# Patient Record
Sex: Female | Born: 1955 | Race: White | Hispanic: No | Marital: Married | Smoking: Never smoker
Health system: Southern US, Community
[De-identification: ages and names within clinical notes are randomized; demographics above are authoritative.]

## PROBLEM LIST (undated history)

## (undated) DIAGNOSIS — F32A Depression, unspecified: Secondary | ICD-10-CM

## (undated) DIAGNOSIS — E079 Disorder of thyroid, unspecified: Secondary | ICD-10-CM

## (undated) DIAGNOSIS — K5730 Diverticulosis of large intestine without perforation or abscess without bleeding: Secondary | ICD-10-CM

## (undated) DIAGNOSIS — R0602 Shortness of breath: Secondary | ICD-10-CM

## (undated) DIAGNOSIS — M2550 Pain in unspecified joint: Secondary | ICD-10-CM

## (undated) DIAGNOSIS — E039 Hypothyroidism, unspecified: Secondary | ICD-10-CM

## (undated) DIAGNOSIS — I10 Essential (primary) hypertension: Secondary | ICD-10-CM

## (undated) DIAGNOSIS — H539 Unspecified visual disturbance: Secondary | ICD-10-CM

## (undated) DIAGNOSIS — K5792 Diverticulitis of intestine, part unspecified, without perforation or abscess without bleeding: Secondary | ICD-10-CM

## (undated) DIAGNOSIS — J302 Other seasonal allergic rhinitis: Secondary | ICD-10-CM

## (undated) DIAGNOSIS — K219 Gastro-esophageal reflux disease without esophagitis: Secondary | ICD-10-CM

## (undated) DIAGNOSIS — R519 Headache, unspecified: Secondary | ICD-10-CM

## (undated) DIAGNOSIS — K5900 Constipation, unspecified: Secondary | ICD-10-CM

## (undated) DIAGNOSIS — E559 Vitamin D deficiency, unspecified: Secondary | ICD-10-CM

## (undated) DIAGNOSIS — R5383 Other fatigue: Secondary | ICD-10-CM

## (undated) DIAGNOSIS — M7989 Other specified soft tissue disorders: Secondary | ICD-10-CM

## (undated) DIAGNOSIS — M549 Dorsalgia, unspecified: Secondary | ICD-10-CM

## (undated) DIAGNOSIS — J301 Allergic rhinitis due to pollen: Secondary | ICD-10-CM

## (undated) DIAGNOSIS — M1990 Unspecified osteoarthritis, unspecified site: Secondary | ICD-10-CM

## (undated) DIAGNOSIS — F329 Major depressive disorder, single episode, unspecified: Secondary | ICD-10-CM

## (undated) DIAGNOSIS — F909 Attention-deficit hyperactivity disorder, unspecified type: Secondary | ICD-10-CM

## (undated) DIAGNOSIS — J45909 Unspecified asthma, uncomplicated: Secondary | ICD-10-CM

## (undated) DIAGNOSIS — Z8719 Personal history of other diseases of the digestive system: Secondary | ICD-10-CM

## (undated) DIAGNOSIS — F419 Anxiety disorder, unspecified: Secondary | ICD-10-CM

## (undated) DIAGNOSIS — F1090 Alcohol use, unspecified, uncomplicated: Secondary | ICD-10-CM

## (undated) DIAGNOSIS — Z7289 Other problems related to lifestyle: Secondary | ICD-10-CM

## (undated) HISTORY — DX: Essential (primary) hypertension: I10

## (undated) HISTORY — DX: Depression, unspecified: F32.A

## (undated) HISTORY — DX: Pain in unspecified joint: M25.50

## (undated) HISTORY — DX: Allergic rhinitis due to pollen: J30.1

## (undated) HISTORY — DX: Gastro-esophageal reflux disease without esophagitis: K21.9

## (undated) HISTORY — PX: COLONOSCOPY: SHX174

## (undated) HISTORY — DX: Hypothyroidism, unspecified: E03.9

## (undated) HISTORY — DX: Other seasonal allergic rhinitis: J30.2

## (undated) HISTORY — DX: Constipation, unspecified: K59.00

## (undated) HISTORY — DX: Unspecified asthma, uncomplicated: J45.909

## (undated) HISTORY — DX: Attention-deficit hyperactivity disorder, unspecified type: F90.9

## (undated) HISTORY — DX: Alcohol use, unspecified, uncomplicated: F10.90

## (undated) HISTORY — DX: Other specified soft tissue disorders: M79.89

## (undated) HISTORY — DX: Unspecified visual disturbance: H53.9

## (undated) HISTORY — DX: Other fatigue: R53.83

## (undated) HISTORY — DX: Diverticulitis of intestine, part unspecified, without perforation or abscess without bleeding: K57.92

## (undated) HISTORY — PX: HAND SURGERY: SHX662

## (undated) HISTORY — DX: Other problems related to lifestyle: Z72.89

## (undated) HISTORY — PX: TONSILLECTOMY: SUR1361

## (undated) HISTORY — DX: Dorsalgia, unspecified: M54.9

## (undated) HISTORY — DX: Anxiety disorder, unspecified: F41.9

## (undated) HISTORY — DX: Shortness of breath: R06.02

## (undated) HISTORY — DX: Vitamin D deficiency, unspecified: E55.9

## (undated) HISTORY — DX: Major depressive disorder, single episode, unspecified: F32.9

---

## 1999-01-31 ENCOUNTER — Other Ambulatory Visit: Admission: RE | Admit: 1999-01-31 | Discharge: 1999-01-31 | Payer: Self-pay | Admitting: Obstetrics and Gynecology

## 2000-06-06 ENCOUNTER — Other Ambulatory Visit: Admission: RE | Admit: 2000-06-06 | Discharge: 2000-06-06 | Payer: Self-pay | Admitting: Obstetrics and Gynecology

## 2001-06-10 ENCOUNTER — Other Ambulatory Visit: Admission: RE | Admit: 2001-06-10 | Discharge: 2001-06-10 | Payer: Self-pay | Admitting: Obstetrics and Gynecology

## 2001-07-24 ENCOUNTER — Ambulatory Visit (HOSPITAL_COMMUNITY): Admission: RE | Admit: 2001-07-24 | Discharge: 2001-07-24 | Payer: Self-pay | Admitting: Obstetrics and Gynecology

## 2001-07-27 ENCOUNTER — Emergency Department (HOSPITAL_COMMUNITY): Admission: EM | Admit: 2001-07-27 | Discharge: 2001-07-28 | Payer: Self-pay | Admitting: Emergency Medicine

## 2001-07-28 ENCOUNTER — Encounter: Payer: Self-pay | Admitting: Emergency Medicine

## 2003-05-28 ENCOUNTER — Other Ambulatory Visit: Admission: RE | Admit: 2003-05-28 | Discharge: 2003-05-28 | Payer: Self-pay | Admitting: Obstetrics & Gynecology

## 2005-06-12 ENCOUNTER — Ambulatory Visit (HOSPITAL_COMMUNITY): Admission: RE | Admit: 2005-06-12 | Discharge: 2005-06-12 | Payer: Self-pay | Admitting: Chiropractic Medicine

## 2012-04-15 ENCOUNTER — Other Ambulatory Visit: Payer: Self-pay | Admitting: Obstetrics and Gynecology

## 2014-03-01 ENCOUNTER — Other Ambulatory Visit: Payer: Self-pay | Admitting: Obstetrics and Gynecology

## 2014-03-02 LAB — CYTOLOGY - PAP

## 2014-07-09 DIAGNOSIS — K5792 Diverticulitis of intestine, part unspecified, without perforation or abscess without bleeding: Secondary | ICD-10-CM

## 2014-07-09 HISTORY — DX: Diverticulitis of intestine, part unspecified, without perforation or abscess without bleeding: K57.92

## 2014-11-15 ENCOUNTER — Telehealth: Payer: Self-pay | Admitting: *Deleted

## 2014-11-15 NOTE — Telephone Encounter (Signed)
Pt states she has an appt 11/18/2014 for her heel pain, but would like to know what to do about the pain until she is seen.  I encouraged pt to ice her foot at least 3-4 times/day for 10-15 minutes, and if she could take a OTC antiinflammatory medication to take it as directed.

## 2014-11-18 ENCOUNTER — Ambulatory Visit: Payer: Self-pay | Admitting: Podiatry

## 2014-11-23 ENCOUNTER — Ambulatory Visit: Payer: Self-pay | Admitting: Podiatry

## 2015-01-16 ENCOUNTER — Emergency Department (HOSPITAL_COMMUNITY): Payer: 59

## 2015-01-16 ENCOUNTER — Inpatient Hospital Stay (HOSPITAL_COMMUNITY)
Admission: EM | Admit: 2015-01-16 | Discharge: 2015-01-21 | DRG: 392 | Disposition: A | Discharge status: Home / Self Care | Payer: 59 | Attending: Internal Medicine | Admitting: Internal Medicine

## 2015-01-16 ENCOUNTER — Encounter (HOSPITAL_COMMUNITY): Payer: Self-pay

## 2015-01-16 DIAGNOSIS — E039 Hypothyroidism, unspecified: Secondary | ICD-10-CM | POA: Diagnosis present

## 2015-01-16 DIAGNOSIS — K5720 Diverticulitis of large intestine with perforation and abscess without bleeding: Principal | ICD-10-CM | POA: Diagnosis present

## 2015-01-16 DIAGNOSIS — Z888 Allergy status to other drugs, medicaments and biological substances status: Secondary | ICD-10-CM

## 2015-01-16 DIAGNOSIS — K5792 Diverticulitis of intestine, part unspecified, without perforation or abscess without bleeding: Secondary | ICD-10-CM | POA: Diagnosis present

## 2015-01-16 DIAGNOSIS — R109 Unspecified abdominal pain: Secondary | ICD-10-CM | POA: Diagnosis not present

## 2015-01-16 DIAGNOSIS — E860 Dehydration: Secondary | ICD-10-CM | POA: Diagnosis present

## 2015-01-16 HISTORY — DX: Diverticulosis of large intestine without perforation or abscess without bleeding: K57.30

## 2015-01-16 HISTORY — DX: Disorder of thyroid, unspecified: E07.9

## 2015-01-16 LAB — CBC
HEMATOCRIT: 44.2 % (ref 36.0–46.0)
HEMOGLOBIN: 15.1 g/dL — AB (ref 12.0–15.0)
MCH: 32.1 pg (ref 26.0–34.0)
MCHC: 34.2 g/dL (ref 30.0–36.0)
MCV: 94 fL (ref 78.0–100.0)
Platelets: 274 10*3/uL (ref 150–400)
RBC: 4.7 MIL/uL (ref 3.87–5.11)
RDW: 13.4 % (ref 11.5–15.5)
WBC: 14.5 10*3/uL — ABNORMAL HIGH (ref 4.0–10.5)

## 2015-01-16 LAB — URINALYSIS, ROUTINE W REFLEX MICROSCOPIC
Bilirubin Urine: NEGATIVE
Glucose, UA: NEGATIVE mg/dL
Hgb urine dipstick: NEGATIVE
KETONES UR: 15 mg/dL — AB
LEUKOCYTES UA: NEGATIVE
Nitrite: NEGATIVE
PROTEIN: NEGATIVE mg/dL
Specific Gravity, Urine: 1.008 (ref 1.005–1.030)
UROBILINOGEN UA: 0.2 mg/dL (ref 0.0–1.0)
pH: 7 (ref 5.0–8.0)

## 2015-01-16 LAB — BASIC METABOLIC PANEL
Anion gap: 9 (ref 5–15)
BUN: 9 mg/dL (ref 6–20)
CALCIUM: 9.2 mg/dL (ref 8.9–10.3)
CO2: 24 mmol/L (ref 22–32)
CREATININE: 0.67 mg/dL (ref 0.44–1.00)
Chloride: 103 mmol/L (ref 101–111)
GFR calc Af Amer: >60 mL/min (ref >60)
GFR calc non Af Amer: >60 mL/min (ref >60)
GLUCOSE: 131 mg/dL — AB (ref 65–99)
Potassium: 3.5 mmol/L (ref 3.5–5.1)
SODIUM: 136 mmol/L (ref 135–145)

## 2015-01-16 LAB — I-STAT TROPONIN, ED: Troponin i, poc: 0 ng/mL (ref 0.00–0.08)

## 2015-01-16 MED ORDER — SODIUM CHLORIDE 0.9 % IV BOLUS (SEPSIS)
1000.0000 mL | Freq: Once | INTRAVENOUS | Status: AC
  Administered 2015-01-16: 1000 mL via INTRAVENOUS

## 2015-01-16 MED ORDER — IOHEXOL 300 MG/ML  SOLN
20.0000 mL | INTRAMUSCULAR | Status: AC
  Administered 2015-01-16: 25 mL via ORAL
  Administered 2015-01-17: 20 mL via ORAL

## 2015-01-16 MED ORDER — HYDROMORPHONE HCL 1 MG/ML IJ SOLN
1.0000 mg | Freq: Once | INTRAMUSCULAR | Status: AC
  Administered 2015-01-16: 1 mg via INTRAVENOUS
  Filled 2015-01-16: qty 1

## 2015-01-16 MED ORDER — IOHEXOL 300 MG/ML  SOLN
100.0000 mL | Freq: Once | INTRAMUSCULAR | Status: AC | PRN
  Administered 2015-01-16: 100 mL via INTRAVENOUS

## 2015-01-16 NOTE — ED Notes (Signed)
Pt also reports that she took simethicone for gas with no relief and also she took 2 baby asa.

## 2015-01-16 NOTE — ED Notes (Signed)
Pt reports abd pain that radiates to chest and reports she may be dehydrated also and just feels bad in general.

## 2015-01-16 NOTE — ED Provider Notes (Signed)
CSN: [id number]     Arrival date & time 01/16/15  2016 History   First MD Initiated Contact with Patient 01/16/15 2128     Chief Complaint  Patient presents with  . Abdominal Pain  . Dehydration  . Chest Pain     (Consider location/radiation/quality/duration/timing/severity/associated sxs/prior Treatment) Patient is a 59 y.o. female presenting with abdominal pain and chest pain. The history is provided by the patient.  Abdominal Pain Pain location:  LUQ Pain quality: cramping and sharp   Pain radiates to:  Chest Pain severity:  Severe Onset quality:  Sudden Timing:  Constant Relieved by:  Nothing Worsened by:  Nothing tried Associated symptoms: chest pain   Associated symptoms: no cough, no fever, no shortness of breath and no vomiting   Chest Pain Associated symptoms: abdominal pain   Associated symptoms: no cough, no fever, no shortness of breath and not vomiting     Past Medical History  Diagnosis Date  . Thyroid disease   . Diverticula of colon    History reviewed. No pertinent past surgical history. History reviewed. No pertinent family history. History  Substance Use Topics  . Smoking status: Never Smoker   . Smokeless tobacco: Not on file  . Alcohol Use: Yes   OB History    No data available     Review of Systems  Constitutional: Negative for fever.  Respiratory: Negative for cough and shortness of breath.   Cardiovascular: Positive for chest pain.  Gastrointestinal: Positive for abdominal pain. Negative for vomiting.  All other systems reviewed and are negative.     Allergies  Review of patient's allergies indicates no known allergies.  Home Medications   Prior to Admission medications   Not on File   BP 142/87 mmHg  Pulse 99  Temp(Src) 99 F (37.2 C) (Oral)  Resp 12  Ht 5\' 8"  (1.727 m)  Wt 184 lb (83.462 kg)  BMI 27.98 kg/m2  SpO2 98% Physical Exam  Constitutional: She is oriented to person, place, and time. She appears well-developed  and well-nourished. No distress.  HENT:  Head: Normocephalic and atraumatic.  Mouth/Throat: Oropharynx is clear and moist.  Eyes: EOM are normal. Pupils are equal, round, and reactive to light.  Neck: Normal range of motion. Neck supple.  Cardiovascular: Normal rate and regular rhythm.  Exam reveals no friction rub.   No murmur heard. Pulmonary/Chest: Effort normal and breath sounds normal. No respiratory distress. She has no wheezes. She has no rales.  Abdominal: Soft. She exhibits no distension. There is tenderness (LUQ, suprapubic, LLQ). There is no rebound.  Musculoskeletal: Normal range of motion. She exhibits no edema.  Neurological: She is alert and oriented to person, place, and time.  Skin: No rash noted. She is not diaphoretic.  Nursing note and vitals reviewed.   ED Course  Procedures (including critical care time) Labs Review Labs Reviewed  CBC - Abnormal; Notable for the following:    WBC 14.5 (*)    Hemoglobin 15.1 (*)    All other components within normal limits  BASIC METABOLIC PANEL - Abnormal; Notable for the following:    Glucose, Bld 131 (*)    All other components within normal limits  URINALYSIS, ROUTINE W REFLEX MICROSCOPIC (NOT AT [facility]) - Abnormal; Notable for the following:    Ketones, ur 15 (*)    All other components within normal limits  I-STAT TROPOININ, ED    Imaging Review Dg Chest 2 View  01/16/2015   CLINICAL DATA:  Weakness  with abdominal pain and nausea since 02:30 today.  EXAM: CHEST  2 VIEW  COMPARISON:  Left clavicle 10/01/2007  FINDINGS: Slightly shallow inspiration. Normal heart size and pulmonary vascularity. No focal airspace disease or consolidation in the lungs. No blunting of costophrenic angles. No pneumothorax. Mediastinal contours appear intact. Tortuous aorta. Mild degenerative changes in the spine.  IMPRESSION: No active cardiopulmonary disease.   Electronically Signed   By: Lucienne Capers M.D.   On: 01/16/2015 22:21     EKG  Interpretation None     CRITICAL CARE Performed by: Osvaldo Shipper   Total critical care time: 30 minutes  Critical care time was exclusive of separately billable procedures and treating other patients.  Critical care was necessary to treat or prevent imminent or life-threatening deterioration.  Critical care was time spent personally by me on the following activities: development of treatment plan with patient and/or surrogate as well as nursing, discussions with consultants, evaluation of patient's response to treatment, examination of patient, obtaining history from patient or surrogate, ordering and performing treatments and interventions, ordering and review of laboratory studies, ordering and review of radiographic studies, pulse oximetry and re-evaluation of patient's condition.  MDM   Final diagnoses:  Diverticulitis of large intestine with perforation without bleeding    32F here with sudden onset abdominal pain. Hx of uncomplicated diverticulitis. No fevers or vomiting today. AFVSS here. Left sided abdominal pain with worsening pain in suprapubic area, LLQ, LUQ. No peritonitis. No rebound or guarding. CT done, reviewed by me. Read as perforated diverticulitis. Small amount of free air in mesentery, underneath R diaphragm. Dr. Hulen Skains with surgery notified, she will be seen in the morning by them. Admitted to medicine. Zosyn given for antibiotics.   Evelina Bucy, MD 01/17/15 Shelah Lewandowsky

## 2015-01-17 DIAGNOSIS — K5720 Diverticulitis of large intestine with perforation and abscess without bleeding: Secondary | ICD-10-CM | POA: Diagnosis present

## 2015-01-17 DIAGNOSIS — Z888 Allergy status to other drugs, medicaments and biological substances status: Secondary | ICD-10-CM | POA: Diagnosis not present

## 2015-01-17 DIAGNOSIS — E039 Hypothyroidism, unspecified: Secondary | ICD-10-CM | POA: Diagnosis present

## 2015-01-17 DIAGNOSIS — K5792 Diverticulitis of intestine, part unspecified, without perforation or abscess without bleeding: Secondary | ICD-10-CM | POA: Diagnosis not present

## 2015-01-17 DIAGNOSIS — E038 Other specified hypothyroidism: Secondary | ICD-10-CM | POA: Diagnosis not present

## 2015-01-17 DIAGNOSIS — R109 Unspecified abdominal pain: Secondary | ICD-10-CM | POA: Diagnosis present

## 2015-01-17 DIAGNOSIS — E860 Dehydration: Secondary | ICD-10-CM | POA: Diagnosis present

## 2015-01-17 DIAGNOSIS — K5780 Diverticulitis of intestine, part unspecified, with perforation and abscess without bleeding: Secondary | ICD-10-CM | POA: Diagnosis not present

## 2015-01-17 LAB — HEPATIC FUNCTION PANEL
ALBUMIN: 4.3 g/dL (ref 3.5–5.0)
ALT: 19 U/L (ref 14–54)
AST: 27 U/L (ref 15–41)
Alkaline Phosphatase: 48 U/L (ref 38–126)
Bilirubin, Direct: 0.1 mg/dL (ref 0.1–0.5)
Indirect Bilirubin: 0.8 mg/dL (ref 0.3–0.9)
TOTAL PROTEIN: 6.9 g/dL (ref 6.5–8.1)
Total Bilirubin: 0.9 mg/dL (ref 0.3–1.2)

## 2015-01-17 LAB — LIPASE, BLOOD: LIPASE: 20 U/L — AB (ref 22–51)

## 2015-01-17 LAB — TSH: TSH: 0.893 u[IU]/mL (ref 0.350–4.500)

## 2015-01-17 MED ORDER — PIPERACILLIN-TAZOBACTAM 3.375 G IVPB 30 MIN: 3.3750 g | Freq: Four times a day (QID) | INTRAVENOUS | Status: DC

## 2015-01-17 MED ORDER — PANTOPRAZOLE SODIUM 40 MG IV SOLR
40.0000 mg | INTRAVENOUS | Status: DC
  Administered 2015-01-17 – 2015-01-20 (×4): 40 mg via INTRAVENOUS
  Filled 2015-01-17 (×4): qty 40

## 2015-01-17 MED ORDER — PIPERACILLIN-TAZOBACTAM 3.375 G IVPB
3.3750 g | Freq: Three times a day (TID) | INTRAVENOUS | Status: DC
Start: 2015-01-17 — End: 2015-01-20
  Administered 2015-01-17 – 2015-01-20 (×10): 3.375 g via INTRAVENOUS
  Filled 2015-01-17 (×13): qty 50

## 2015-01-17 MED ORDER — LEVOTHYROXINE SODIUM 50 MCG PO TABS
75.0000 ug | ORAL_TABLET | Freq: Every day | ORAL | Status: DC
  Administered 2015-01-17: 75 ug via ORAL
  Filled 2015-01-17 (×2): qty 1

## 2015-01-17 MED ORDER — ONDANSETRON HCL 4 MG/2ML IJ SOLN
4.0000 mg | Freq: Four times a day (QID) | INTRAMUSCULAR | Status: DC | PRN
  Administered 2015-01-17 (×2): 4 mg via INTRAVENOUS
  Filled 2015-01-17 (×2): qty 2

## 2015-01-17 MED ORDER — ALBUTEROL SULFATE (2.5 MG/3ML) 0.083% IN NEBU: 3.0000 mL | INHALATION_SOLUTION | Freq: Four times a day (QID) | RESPIRATORY_TRACT | Status: DC | PRN

## 2015-01-17 MED ORDER — HYDROMORPHONE HCL 1 MG/ML IJ SOLN: 1.0000 mg | Freq: Once | INTRAMUSCULAR | Status: DC

## 2015-01-17 MED ORDER — HYDROMORPHONE HCL 1 MG/ML IJ SOLN
0.5000 mg | INTRAMUSCULAR | Status: DC | PRN
  Administered 2015-01-17: 2 mg via INTRAVENOUS
  Filled 2015-01-17: qty 2

## 2015-01-17 MED ORDER — ALPRAZOLAM 0.25 MG PO TABS: 0.2500 mg | ORAL_TABLET | Freq: Every evening | ORAL | Status: DC | PRN

## 2015-01-17 MED ORDER — LEVOTHYROXINE SODIUM 100 MCG IV SOLR
25.0000 ug | Freq: Every day | INTRAVENOUS | Status: DC
  Administered 2015-01-18 – 2015-01-20 (×3): 25 ug via INTRAVENOUS
  Filled 2015-01-17 (×5): qty 5

## 2015-01-17 MED ORDER — SODIUM CHLORIDE 0.9 % IV SOLN
INTRAVENOUS | Status: AC
  Administered 2015-01-17 – 2015-01-18 (×5): via INTRAVENOUS

## 2015-01-17 MED ORDER — PIPERACILLIN-TAZOBACTAM 3.375 G IVPB 30 MIN
3.3750 g | Freq: Once | INTRAVENOUS | Status: AC
  Administered 2015-01-17: 3.375 g via INTRAVENOUS
  Filled 2015-01-17: qty 50

## 2015-01-17 MED ORDER — HYDROMORPHONE HCL 1 MG/ML IJ SOLN
0.5000 mg | INTRAMUSCULAR | Status: DC | PRN
  Administered 2015-01-17 – 2015-01-19 (×7): 0.5 mg via INTRAVENOUS
  Filled 2015-01-17 (×5): qty 1

## 2015-01-17 MED ORDER — HEPARIN SODIUM (PORCINE) 5000 UNIT/ML IJ SOLN
5000.0000 [IU] | Freq: Three times a day (TID) | INTRAMUSCULAR | Status: DC
Start: 2015-01-17 — End: 2015-01-21
  Administered 2015-01-17 – 2015-01-21 (×11): 5000 [IU] via SUBCUTANEOUS
  Filled 2015-01-17 (×11): qty 1

## 2015-01-17 MED ORDER — HYDROMORPHONE HCL 1 MG/ML IJ SOLN
1.0000 mg | Freq: Once | INTRAMUSCULAR | Status: AC
  Administered 2015-01-17: 1 mg via INTRAVENOUS
  Filled 2015-01-17: qty 1

## 2015-01-17 MED ORDER — CETYLPYRIDINIUM CHLORIDE 0.05 % MT LIQD
7.0000 mL | Freq: Two times a day (BID) | OROMUCOSAL | Status: DC
  Administered 2015-01-17 – 2015-01-20 (×5): 7 mL via OROMUCOSAL

## 2015-01-17 MED ORDER — MORPHINE SULFATE 4 MG/ML IJ SOLN
4.0000 mg | INTRAMUSCULAR | Status: DC | PRN
  Administered 2015-01-17: 4 mg via INTRAVENOUS
  Filled 2015-01-17: qty 1

## 2015-01-17 NOTE — H&P (Signed)
History and Physical  Sylvia Hopkins QQV:[id number] DOB: 05-08-1956 DOA: 01/16/2015  PCP: Reginia Naas, MD   Chief Complaint: abdominal pain   HPI: Sylvia Hopkins is a 59 y.o. female with history of hypothyroidism who came to the ED with cc of abdominal pain that started suddenly at 2pm yesterday, all over her abdomen but mostly in lower left quadrant, with no diarrhea or constipation. She had nausea but did not vomit. She had chills but no fevers. She had mild chest discomfort due to abdominal distention and pain but no dyspnea or cough. No blood stool.   Review of Systems:  Pt denies any fever. She denies chest pain. .  Review of systems are otherwise negative  Past Medical History  Diagnosis Date  . Thyroid disease   . Diverticula of colon    History reviewed. No pertinent past surgical history. Social History:  reports that she has never smoked. She does not have any smokeless tobacco history on file. She reports that she drinks alcohol. She reports that she does not use illicit drugs. Patient lives at home with husband.   Allergies  Allergen Reactions  . Advil [Ibuprofen] Other (See Comments)    Upset stomach     FH: No colon cancer.    Prior to Admission medications   Medication Sig Start Date End Date Taking? Authorizing Provider  albuterol (PROVENTIL HFA;VENTOLIN HFA) 108 (90 BASE) MCG/ACT inhaler Inhale 2 puffs into the lungs every 6 (six) hours as needed for wheezing or shortness of breath.   Yes Historical Provider, MD  ALPRAZolam Duanne Moron) 0.25 MG tablet Take 0.25 mg by mouth at bedtime as needed for anxiety.   Yes Historical Provider, MD  amphetamine-dextroamphetamine (ADDERALL) 30 MG tablet Take 30 mg by mouth See admin instructions. Takes Monday-Friday   Yes Historical Provider, MD  azelastine (ASTELIN) 0.1 % nasal spray Place 1 spray into both nostrils 2 (two) times daily. Use in each nostril as directed   Yes Historical Provider, MD    estradiol-norethindrone [facility]) 0.05-0.14 MG/DAY Place 1 patch onto the skin 2 (two) times a week.   Yes Historical Provider, MD  Fexofenadine HCl (ALLEGRA PO) Take 1 tablet by mouth every 12 (twelve) hours.   Yes Historical Provider, MD  levothyroxine (SYNTHROID, LEVOTHROID) 75 MCG tablet Take 75 mcg by mouth daily before breakfast.   Yes Historical Provider, MD    Physical Exam: BP 117/76 mmHg  Pulse 90  Temp(Src) 98.4 F (36.9 C) (Oral)  Resp 18  Ht 5\' 8"  (1.727 m)  Wt 89.2 kg (196 lb 10.4 oz)  BMI 29.91 kg/m2  SpO2 96%  General:  In NAD. Eyes: Non icteric.  ENT: oropharynx with normal mucosa.  Neck: supple.  Cardiovascular: RRR. No M/G/R Respiratory: CTAB Abdomen: mildly distended, decreased BS, tender to palpation , no rebound tenderness.  Skin: no rash.  Musculoskeletal: No deformity. Full ROM.  Neurologic: alert, O#4, no focal deficits.           Labs on Admission:  Basic Metabolic Panel:  Recent Labs Lab 01/16/15 2028  NA 136  K 3.5  CL 103  CO2 24  GLUCOSE 131*  BUN 9  CREATININE 0.67  CALCIUM 9.2   Liver Function Tests:  Recent Labs Lab 01/17/15 0032  AST 27  ALT 19  ALKPHOS 48  BILITOT 0.9  PROT 6.9  ALBUMIN 4.3    Recent Labs Lab 01/17/15 0032  LIPASE 20*   No results for input(s): AMMONIA in the last 168  hours. CBC:  Recent Labs Lab 01/16/15 2028  WBC 14.5*  HGB 15.1*  HCT 44.2  MCV 94.0  PLT 274   Cardiac Enzymes: No results for input(s): CKTOTAL, CKMB, CKMBINDEX, TROPONINI in the last 168 hours.  BNP (last 3 results) No results for input(s): BNP in the last 8760 hours.  ProBNP (last 3 results) No results for input(s): PROBNP in the last 8760 hours.  CBG: No results for input(s): GLUCAP in the last 168 hours.  Radiological Exams on Admission: Dg Chest 2 View  01/16/2015   CLINICAL DATA:  Weakness with abdominal pain and nausea since 02:30 today.  EXAM: CHEST  2 VIEW  COMPARISON:  Left clavicle 10/01/2007   FINDINGS: Slightly shallow inspiration. Normal heart size and pulmonary vascularity. No focal airspace disease or consolidation in the lungs. No blunting of costophrenic angles. No pneumothorax. Mediastinal contours appear intact. Tortuous aorta. Mild degenerative changes in the spine.  IMPRESSION: No active cardiopulmonary disease.   Electronically Signed   By: Lucienne Capers M.D.   On: 01/16/2015 22:21   Ct Abdomen Pelvis W Contrast  01/17/2015   CLINICAL DATA:  59 year old female with mid abdominal pain  EXAM: CT ABDOMEN AND PELVIS WITH CONTRAST  TECHNIQUE: Multidetector CT imaging of the abdomen and pelvis was performed using the standard protocol following bolus administration of intravenous contrast.  CONTRAST:  119mL OMNIPAQUE IOHEXOL 300 MG/ML  SOLN  COMPARISON:  None.  FINDINGS: The the visualized lung bases are clear.  Small pneumoperitoneum.  No free fluid.  Small scattered hepatic hypodense lesions are not well characterized but likely represent cysts. The gallbladder, pancreas, spleen, adrenal glands, kidneys, visualized ureters, and urinary bladder appear unremarkable. There is a 3.4 x 3.1 cm posterior uterine fibroid. Ultrasound may provide better evaluation of the pelvic structures.  There is extensive sigmoid diverticulosis. There is focal area of inflammatory changes involving the sigmoid colon compatible with diverticulitis. No drainable fluid collection/ abscess identified. Small pockets of air noted throughout the mesentery and peritoneum compatible with micro perforations.  The visualized abdominal aorta and IVC appear unremarkable. No portal venous gas identified. There is no lymphadenopathy. Small fat containing umbilical hernia. The degenerative changes of the spine. No acute fracture.  IMPRESSION: Perforated sigmoid diverticulitis.  No abscess.  Critical Value/emergent results were called by telephone at the time of interpretation on 01/17/2015 at 12:06 am to Dr. Evelina Bucy , who  verbally acknowledged these results.   Electronically Signed   By: Anner Crete M.D.   On: 01/17/2015 00:06     Assessment/Plan  Acute diverticulitis:  Keep NPO, on IVF NS 125 cc/hr G.surgery was called by ED. Will see patient in am.  CT scan as above.  Started on Zosyn 3.375 Q6H zofran prn N/V PPI IV  Morphine prn pain  Hypothyroidism:  Continue synthroid   Consultants: surgery.   Code Status: full  Family Communication: husband at bedside.   Disposition Plan: admission for treatment and surgery eval.   Gennaro Africa MD [facility]

## 2015-01-17 NOTE — Progress Notes (Signed)
PROGRESS NOTE  Sylvia Hopkins:[id number] DOB: 06-15-1956 DOA: 01/16/2015 PCP: Reginia Naas, MD  HPI/Recap of past 24 hours:  Less pain, no n/v, no fever, husband in room.  Assessment/Plan: Active Problems:   Diverticulitis  Diverticulitis with microperforation, no abscess On iv abx/prn pain meds/ivf. Appreciate surgery input. Continue npo in case of needing surgery.  Hypothyroidism: tsh 0.89. On reduced dose of synthroid through IV due to npo.  Code Status: full  Family Communication: patient and husband  Disposition Plan: remain inpatient   Consultants:  General surgery  Procedures:    Antibiotics:  Zosyn from 7/10   Objective: BP 99/70 mmHg  Pulse 66  Temp(Src) 97.8 F (36.6 C) (Oral)  Resp 18  Ht 5\' 8"  (1.727 m)  Wt 89.2 kg (196 lb 10.4 oz)  BMI 29.91 kg/m2  SpO2 98%  Intake/Output Summary (Last 24 hours) at 01/17/15 1603 Last data filed at 01/17/15 1403  Gross per 24 hour  Intake 2466.67 ml  Output      0 ml  Net 2466.67 ml   Filed Weights   01/16/15 2022 01/17/15 0149  Weight: 83.462 kg (184 lb) 89.2 kg (196 lb 10.4 oz)    Exam:   General:  NAD  Cardiovascular: RRR  Respiratory: CTABL  Abdomen:  tender lower quadrant, no rebound, Soft/ND/NT, positive BS  Musculoskeletal: No Edema  Neuro: aaox3  Data Reviewed: Basic Metabolic Panel:  Recent Labs Lab 01/16/15 2028  NA 136  K 3.5  CL 103  CO2 24  GLUCOSE 131*  BUN 9  CREATININE 0.67  CALCIUM 9.2   Liver Function Tests:  Recent Labs Lab 01/17/15 0032  AST 27  ALT 19  ALKPHOS 48  BILITOT 0.9  PROT 6.9  ALBUMIN 4.3    Recent Labs Lab 01/17/15 0032  LIPASE 20*   No results for input(s): AMMONIA in the last 168 hours. CBC:  Recent Labs Lab 01/16/15 2028  WBC 14.5*  HGB 15.1*  HCT 44.2  MCV 94.0  PLT 274   Cardiac Enzymes:   No results for input(s): CKTOTAL, CKMB, CKMBINDEX, TROPONINI in the last 168 hours. BNP (last 3 results) No  results for input(s): BNP in the last 8760 hours.  ProBNP (last 3 results) No results for input(s): PROBNP in the last 8760 hours.  CBG: No results for input(s): GLUCAP in the last 168 hours.  No results found for this or any previous visit (from the past 240 hour(s)).   Studies: Dg Chest 2 View  01/16/2015   CLINICAL DATA:  Weakness with abdominal pain and nausea since 02:30 today.  EXAM: CHEST  2 VIEW  COMPARISON:  Left clavicle 10/01/2007  FINDINGS: Slightly shallow inspiration. Normal heart size and pulmonary vascularity. No focal airspace disease or consolidation in the lungs. No blunting of costophrenic angles. No pneumothorax. Mediastinal contours appear intact. Tortuous aorta. Mild degenerative changes in the spine.  IMPRESSION: No active cardiopulmonary disease.   Electronically Signed   By: Lucienne Capers M.D.   On: 01/16/2015 22:21   Ct Abdomen Pelvis W Contrast  01/17/2015   CLINICAL DATA:  59 year old female with mid abdominal pain  EXAM: CT ABDOMEN AND PELVIS WITH CONTRAST  TECHNIQUE: Multidetector CT imaging of the abdomen and pelvis was performed using the standard protocol following bolus administration of intravenous contrast.  CONTRAST:  171mL OMNIPAQUE IOHEXOL 300 MG/ML  SOLN  COMPARISON:  None.  FINDINGS: The the visualized lung bases are clear.  Small pneumoperitoneum.  No free fluid.  Small scattered hepatic hypodense lesions are not well characterized but likely represent cysts. The gallbladder, pancreas, spleen, adrenal glands, kidneys, visualized ureters, and urinary bladder appear unremarkable. There is a 3.4 x 3.1 cm posterior uterine fibroid. Ultrasound may provide better evaluation of the pelvic structures.  There is extensive sigmoid diverticulosis. There is focal area of inflammatory changes involving the sigmoid colon compatible with diverticulitis. No drainable fluid collection/ abscess identified. Small pockets of air noted throughout the mesentery and peritoneum  compatible with micro perforations.  The visualized abdominal aorta and IVC appear unremarkable. No portal venous gas identified. There is no lymphadenopathy. Small fat containing umbilical hernia. The degenerative changes of the spine. No acute fracture.  IMPRESSION: Perforated sigmoid diverticulitis.  No abscess.  Critical Value/emergent results were called by telephone at the time of interpretation on 01/17/2015 at 12:06 am to Dr. Evelina Bucy , who verbally acknowledged these results.   Electronically Signed   By: Anner Crete M.D.   On: 01/17/2015 00:06    Scheduled Meds: . antiseptic oral rinse  7 mL Mouth Rinse BID  . heparin  5,000 Units Subcutaneous 3 times per day  . [START ON 01/18/2015] levothyroxine  25 mcg Intravenous Daily  . pantoprazole (PROTONIX) IV  40 mg Intravenous Q24H  . piperacillin-tazobactam (ZOSYN)  IV  3.375 g Intravenous 3 times per day    Continuous Infusions: . sodium chloride 125 mL/hr at 01/17/15 1033     Time spent: 56min  Even Budlong MD, PhD  [facility] Pager [phone]. If 7PM-7AM, please contact night-coverage at [email or web address], password [facility] 01/17/2015, 4:03 PM  LOS: 0 days

## 2015-01-17 NOTE — Progress Notes (Signed)
Utilization Review Completed.Stephano Arrants T7/05/2015  

## 2015-01-17 NOTE — Consult Note (Signed)
Reason for Consult:Abdominal pain and diverticulitis with midroperforation Referring Physician: Nicosha Struve is an 59 y.o. female.  HPI: Abdominal pain started acutely about 2:30 pm yesterday.  Last bowel movement was before that.  Pain now starting to improve and is now just localized to the lower abdomen.  CT demonstrated sigmoid diverticulitis without associated abscess, but there are a few bubbles of free air around the area of inflammation, in the mesentery, and under the right hemi-diaphragm  Past Medical History  Diagnosis Date  . Thyroid disease   . Diverticula of colon     History reviewed. No pertinent past surgical history.  History reviewed. No pertinent family history.  Social History:  reports that she has never smoked. She does not have any smokeless tobacco history on file. She reports that she drinks alcohol. She reports that she does not use illicit drugs.  Allergies:  Allergies  Allergen Reactions  . Advil [Ibuprofen] Other (See Comments)    Upset stomach     Medications:  Prior to Admission:  Prescriptions prior to admission  Medication Sig Dispense Refill Last Dose  . albuterol (PROVENTIL HFA;VENTOLIN HFA) 108 (90 BASE) MCG/ACT inhaler Inhale 2 puffs into the lungs every 6 (six) hours as needed for wheezing or shortness of breath.   Past Month at Unknown time  . ALPRAZolam (XANAX) 0.25 MG tablet Take 0.25 mg by mouth at bedtime as needed for anxiety.   01/16/2015 at Unknown time  . amphetamine-dextroamphetamine (ADDERALL) 30 MG tablet Take 30 mg by mouth See admin instructions. Takes Monday-Friday   Past Week at Unknown time  . azelastine (ASTELIN) 0.1 % nasal spray Place 1 spray into both nostrils 2 (two) times daily. Use in each nostril as directed   01/16/2015 at Unknown time  . estradiol-norethindrone (COMBIPATCH) 0.05-0.14 MG/DAY Place 1 patch onto the skin 2 (two) times a week.   01/12/2015  . Fexofenadine HCl (ALLEGRA PO) Take 1 tablet by mouth  every 12 (twelve) hours.   01/16/2015 at Unknown time  . levothyroxine (SYNTHROID, LEVOTHROID) 75 MCG tablet Take 75 mcg by mouth daily before breakfast.   01/16/2015 at Unknown time    Results for orders placed or performed during the hospital encounter of 01/16/15 (from the past 48 hour(s))  CBC     Status: Abnormal   Collection Time: 01/16/15  8:28 PM  Result Value Ref Range   WBC 14.5 (H) 4.0 - 10.5 K/uL   RBC 4.70 3.87 - 5.11 MIL/uL   Hemoglobin 15.1 (H) 12.0 - 15.0 g/dL   HCT 44.2 36.0 - 46.0 %   MCV 94.0 78.0 - 100.0 fL   MCH 32.1 26.0 - 34.0 pg   MCHC 34.2 30.0 - 36.0 g/dL   RDW 13.4 11.5 - 15.5 %   Platelets 274 150 - 400 K/uL  Basic metabolic panel     Status: Abnormal   Collection Time: 01/16/15  8:28 PM  Result Value Ref Range   Sodium 136 135 - 145 mmol/L   Potassium 3.5 3.5 - 5.1 mmol/L   Chloride 103 101 - 111 mmol/L   CO2 24 22 - 32 mmol/L   Glucose, Bld 131 (H) 65 - 99 mg/dL   BUN 9 6 - 20 mg/dL   Creatinine, Ser 0.67 0.44 - 1.00 mg/dL   Calcium 9.2 8.9 - 10.3 mg/dL   GFR calc non Af Amer >60 >60 mL/min   GFR calc Af Amer >60 >60 mL/min    Comment: (NOTE) The  eGFR has been calculated using the CKD EPI equation. This calculation has not been validated in all clinical situations. eGFR's persistently <60 mL/min signify possible Chronic Kidney Disease.    Anion gap 9 5 - 15  Urinalysis, Routine w reflex microscopic (not at [facility])     Status: Abnormal   Collection Time: 01/16/15  8:34 PM  Result Value Ref Range   Color, Urine YELLOW YELLOW   APPearance CLEAR CLEAR   Specific Gravity, Urine 1.008 1.005 - 1.030   pH 7.0 5.0 - 8.0   Glucose, UA NEGATIVE NEGATIVE mg/dL   Hgb urine dipstick NEGATIVE NEGATIVE   Bilirubin Urine NEGATIVE NEGATIVE   Ketones, ur 15 (A) NEGATIVE mg/dL   Protein, ur NEGATIVE NEGATIVE mg/dL   Urobilinogen, UA 0.2 0.0 - 1.0 mg/dL   Nitrite NEGATIVE NEGATIVE   Leukocytes, UA NEGATIVE NEGATIVE    Comment: MICROSCOPIC NOT DONE ON URINES  WITH NEGATIVE PROTEIN, BLOOD, LEUKOCYTES, NITRITE, OR GLUCOSE <1000 mg/dL.  I-stat troponin, ED  (not at [facility], [facility])     Status: None   Collection Time: 01/16/15  8:44 PM  Result Value Ref Range   Troponin i, poc 0.00 0.00 - 0.08 ng/mL   Comment 3            Comment: Due to the release kinetics of cTnI, a negative result within the first hours of the onset of symptoms does not rule out myocardial infarction with certainty. If myocardial infarction is still suspected, repeat the test at appropriate intervals.   Lipase, blood     Status: Abnormal   Collection Time: 01/17/15 12:32 AM  Result Value Ref Range   Lipase 20 (L) 22 - 51 U/L  Hepatic function panel     Status: None   Collection Time: 01/17/15 12:32 AM  Result Value Ref Range   Total Protein 6.9 6.5 - 8.1 g/dL   Albumin 4.3 3.5 - 5.0 g/dL   AST 27 15 - 41 U/L   ALT 19 14 - 54 U/L   Alkaline Phosphatase 48 38 - 126 U/L   Total Bilirubin 0.9 0.3 - 1.2 mg/dL   Bilirubin, Direct 0.1 0.1 - 0.5 mg/dL   Indirect Bilirubin 0.8 0.3 - 0.9 mg/dL    Dg Chest 2 View  01/16/2015   CLINICAL DATA:  Weakness with abdominal pain and nausea since 02:30 today.  EXAM: CHEST  2 VIEW  COMPARISON:  Left clavicle 10/01/2007  FINDINGS: Slightly shallow inspiration. Normal heart size and pulmonary vascularity. No focal airspace disease or consolidation in the lungs. No blunting of costophrenic angles. No pneumothorax. Mediastinal contours appear intact. Tortuous aorta. Mild degenerative changes in the spine.  IMPRESSION: No active cardiopulmonary disease.   Electronically Signed   By: Lucienne Capers M.D.   On: 01/16/2015 22:21   Ct Abdomen Pelvis W Contrast  01/17/2015   CLINICAL DATA:  59 year old female with mid abdominal pain  EXAM: CT ABDOMEN AND PELVIS WITH CONTRAST  TECHNIQUE: Multidetector CT imaging of the abdomen and pelvis was performed using the standard protocol following bolus administration of intravenous contrast.  CONTRAST:  15m  OMNIPAQUE IOHEXOL 300 MG/ML  SOLN  COMPARISON:  None.  FINDINGS: The the visualized lung bases are clear.  Small pneumoperitoneum.  No free fluid.  Small scattered hepatic hypodense lesions are not well characterized but likely represent cysts. The gallbladder, pancreas, spleen, adrenal glands, kidneys, visualized ureters, and urinary bladder appear unremarkable. There is a 3.4 x 3.1 cm posterior uterine fibroid. Ultrasound may provide  better evaluation of the pelvic structures.  There is extensive sigmoid diverticulosis. There is focal area of inflammatory changes involving the sigmoid colon compatible with diverticulitis. No drainable fluid collection/ abscess identified. Small pockets of air noted throughout the mesentery and peritoneum compatible with micro perforations.  The visualized abdominal aorta and IVC appear unremarkable. No portal venous gas identified. There is no lymphadenopathy. Small fat containing umbilical hernia. The degenerative changes of the spine. No acute fracture.  IMPRESSION: Perforated sigmoid diverticulitis.  No abscess.  Critical Value/emergent results were called by telephone at the time of interpretation on 01/17/2015 at 12:06 am to Dr. Evelina Bucy , who verbally acknowledged these results.   Electronically Signed   By: Anner Crete M.D.   On: 01/17/2015 00:06    Review of Systems  Constitutional: Negative for fever and chills.  Gastrointestinal: Positive for abdominal pain. Negative for nausea and vomiting.  All other systems reviewed and are negative.  Blood pressure 117/76, pulse 90, temperature 98.4 F (36.9 C), temperature source Oral, resp. rate 18, height '5\' 8"'  (1.727 m), weight 89.2 kg (196 lb 10.4 oz), SpO2 96 %. Physical Exam  Constitutional: She is oriented to person, place, and time. She appears well-developed and well-nourished.  HENT:  Head: Normocephalic and atraumatic.  Eyes: Conjunctivae are normal. Pupils are equal, round, and reactive to light.    Neck: Normal range of motion. Neck supple.  Cardiovascular: Normal rate, regular rhythm and normal heart sounds.   Respiratory: Effort normal and breath sounds normal.  GI: Soft. Normal appearance and bowel sounds are normal. There is tenderness in the right lower quadrant, suprapubic area and left lower quadrant. There is rebound and guarding. There is no rigidity and negative Murphy's sign.    Neurological: She is alert and oriented to person, place, and time. She has normal reflexes.  Skin: Skin is warm and dry.  Psychiatric: She has a normal mood and affect. Her behavior is normal. Judgment and thought content normal.    Assessment/Plan: Acute diverticulitis with microperforation which seems to clinically be improving.  Continue IV antibiotics until symptoms improve.  If they do not continue to improve, then surgery may be necessary including the likelihood of a colostomy  Taylor Levick 01/17/2015, 6:29 AM

## 2015-01-17 NOTE — Progress Notes (Signed)
Patient ID: Sylvia Hopkins, female   DOB: Jan 31, 1956, 59 y.o.   MRN: [id number]    Subjective: Feels slightly better than last night, but still with a lot of pain.  Objective: Vital signs in last 24 hours: Temp:  [98.4 F (36.9 C)-99 F (37.2 C)] 98.4 F (36.9 C) (07/11 0149) Pulse Rate:  [90-103] 90 (07/11 0149) Resp:  [12-21] 18 (07/11 0149) BP: (114-150)/(62-96) 117/76 mmHg (07/11 0149) SpO2:  [93 %-100 %] 96 % (07/11 0149) Weight:  [83.462 kg (184 lb)-89.2 kg (196 lb 10.4 oz)] 89.2 kg (196 lb 10.4 oz) (07/11 0149) Last BM Date: 01/16/15  Intake/Output from previous day: 07/10 0701 - 07/11 0700 In: 2466.7 [I.V.:416.7; IV Piggyback:2050] Out: -  Intake/Output this shift:    PE: Abd: soft, very tender in lower abdomen, greatest in suprapubic region, + guarding, +BS, ND Heart: regular Lungs: CTAB  Lab Results:   Recent Labs  01/16/15 2028  WBC 14.5*  HGB 15.1*  HCT 44.2  PLT 274   BMET  Recent Labs  01/16/15 2028  NA 136  K 3.5  CL 103  CO2 24  GLUCOSE 131*  BUN 9  CREATININE 0.67  CALCIUM 9.2   PT/INR No results for input(s): LABPROT, INR in the last 72 hours. CMP     Component Value Date/Time   NA 136 01/16/2015 2028   K 3.5 01/16/2015 2028   CL 103 01/16/2015 2028   CO2 24 01/16/2015 2028   GLUCOSE 131* 01/16/2015 2028   BUN 9 01/16/2015 2028   CREATININE 0.67 01/16/2015 2028   CALCIUM 9.2 01/16/2015 2028   PROT 6.9 01/17/2015 0032   ALBUMIN 4.3 01/17/2015 0032   AST 27 01/17/2015 0032   ALT 19 01/17/2015 0032   ALKPHOS 48 01/17/2015 0032   BILITOT 0.9 01/17/2015 0032   GFRNONAA >60 01/16/2015 2028   GFRAA >60 01/16/2015 2028   Lipase     Component Value Date/Time   LIPASE 20* 01/17/2015 0032       Studies/Results: Dg Chest 2 View  01/16/2015   CLINICAL DATA:  Weakness with abdominal pain and nausea since 02:30 today.  EXAM: CHEST  2 VIEW  COMPARISON:  Left clavicle 10/01/2007  FINDINGS: Slightly shallow inspiration. Normal  heart size and pulmonary vascularity. No focal airspace disease or consolidation in the lungs. No blunting of costophrenic angles. No pneumothorax. Mediastinal contours appear intact. Tortuous aorta. Mild degenerative changes in the spine.  IMPRESSION: No active cardiopulmonary disease.   Electronically Signed   By: Lucienne Capers M.D.   On: 01/16/2015 22:21   Ct Abdomen Pelvis W Contrast  01/17/2015   CLINICAL DATA:  59 year old female with mid abdominal pain  EXAM: CT ABDOMEN AND PELVIS WITH CONTRAST  TECHNIQUE: Multidetector CT imaging of the abdomen and pelvis was performed using the standard protocol following bolus administration of intravenous contrast.  CONTRAST:  146mL OMNIPAQUE IOHEXOL 300 MG/ML  SOLN  COMPARISON:  None.  FINDINGS: The the visualized lung bases are clear.  Small pneumoperitoneum.  No free fluid.  Small scattered hepatic hypodense lesions are not well characterized but likely represent cysts. The gallbladder, pancreas, spleen, adrenal glands, kidneys, visualized ureters, and urinary bladder appear unremarkable. There is a 3.4 x 3.1 cm posterior uterine fibroid. Ultrasound may provide better evaluation of the pelvic structures.  There is extensive sigmoid diverticulosis. There is focal area of inflammatory changes involving the sigmoid colon compatible with diverticulitis. No drainable fluid collection/ abscess identified. Small pockets of air noted throughout  the mesentery and peritoneum compatible with micro perforations.  The visualized abdominal aorta and IVC appear unremarkable. No portal venous gas identified. There is no lymphadenopathy. Small fat containing umbilical hernia. The degenerative changes of the spine. No acute fracture.  IMPRESSION: Perforated sigmoid diverticulitis.  No abscess.  Critical Value/emergent results were called by telephone at the time of interpretation on 01/17/2015 at 12:06 am to Dr. Evelina Bucy , who verbally acknowledged these results.    Electronically Signed   By: Anner Crete M.D.   On: 01/17/2015 00:06    Anti-infectives: Anti-infectives    Start     Dose/Rate Route Frequency Ordered Stop   01/17/15 0600  piperacillin-tazobactam (ZOSYN) IVPB 3.375 g  Status:  Discontinued     3.375 g 100 mL/hr over 30 Minutes Intravenous 4 times per day 01/17/15 0224 01/17/15 0236   01/17/15 0600  piperacillin-tazobactam (ZOSYN) IVPB 3.375 g     3.375 g 12.5 mL/hr over 240 Minutes Intravenous 3 times per day 01/17/15 0237     01/17/15 0015  piperacillin-tazobactam (ZOSYN) IVPB 3.375 g     3.375 g 100 mL/hr over 30 Minutes Intravenous  Once 01/17/15 0007 01/17/15 0114       Assessment/Plan  1. Diverticulitis with microperforation, no abscess -patient is still very tender today.  Will attempt conservative management to see if she will improve, but if she fails, she will likely require a Hartman's. -cont Zosyn D2 -cont NPO!! -labs were supposedly checked today, but still says "standing"  Labs are written for for tomorrow am -follow closely   LOS: 0 days    Shanedra Lave E 01/17/2015, 10:05 AM Pager: [phone]

## 2015-01-18 DIAGNOSIS — E038 Other specified hypothyroidism: Secondary | ICD-10-CM

## 2015-01-18 DIAGNOSIS — K5780 Diverticulitis of intestine, part unspecified, with perforation and abscess without bleeding: Secondary | ICD-10-CM

## 2015-01-18 LAB — BASIC METABOLIC PANEL
Anion gap: 6 (ref 5–15)
BUN: 7 mg/dL (ref 6–20)
CO2: 26 mmol/L (ref 22–32)
CREATININE: 0.68 mg/dL (ref 0.44–1.00)
Calcium: 8.7 mg/dL — ABNORMAL LOW (ref 8.9–10.3)
Chloride: 108 mmol/L (ref 101–111)
GFR calc non Af Amer: >60 mL/min (ref >60)
Glucose, Bld: 103 mg/dL — ABNORMAL HIGH (ref 65–99)
POTASSIUM: 4.3 mmol/L (ref 3.5–5.1)
Sodium: 140 mmol/L (ref 135–145)

## 2015-01-18 LAB — CBC
HCT: 36.3 % (ref 36.0–46.0)
HEMOGLOBIN: 12 g/dL (ref 12.0–15.0)
MCH: 31.6 pg (ref 26.0–34.0)
MCHC: 33.1 g/dL (ref 30.0–36.0)
MCV: 95.5 fL (ref 78.0–100.0)
Platelets: 226 10*3/uL (ref 150–400)
RBC: 3.8 MIL/uL — AB (ref 3.87–5.11)
RDW: 14 % (ref 11.5–15.5)
WBC: 9.2 10*3/uL (ref 4.0–10.5)

## 2015-01-18 LAB — MAGNESIUM: Magnesium: 2 mg/dL (ref 1.7–2.4)

## 2015-01-18 MED ORDER — ACETAMINOPHEN 325 MG PO TABS
650.0000 mg | ORAL_TABLET | Freq: Once | ORAL | Status: AC
  Administered 2015-01-18: 650 mg via ORAL
  Filled 2015-01-18: qty 2

## 2015-01-18 MED ORDER — FLUTICASONE PROPIONATE 50 MCG/ACT NA SUSP
1.0000 | Freq: Every day | NASAL | Status: DC
  Administered 2015-01-18 – 2015-01-20 (×4): 1 via NASAL
  Filled 2015-01-18: qty 16

## 2015-01-18 MED ORDER — LORAZEPAM 2 MG/ML IJ SOLN
0.5000 mg | Freq: Every evening | INTRAMUSCULAR | Status: DC | PRN
  Administered 2015-01-20 (×2): 0.5 mg via INTRAVENOUS
  Filled 2015-01-18 (×2): qty 1

## 2015-01-18 NOTE — Progress Notes (Signed)
PROGRESS NOTE  Sylvia Hopkins XTK:[id number] DOB: 09/20/1955 DOA: 01/16/2015 PCP: Reginia Naas, MD  HPI/Recap of past 24 hours:  Persistent pain, no n/v, no fever, C/o nasal congestion,  C/o felling puffy   Assessment/Plan: Active Problems:   Diverticulitis  Diverticulitis with microperforation, no abscess On iv abx/prn pain meds/ivf.  Appreciate surgery input. Continue npo in case of needing surgery. Still tender on 7/12, reduce ivf rate to avoid fluids overload  Hypothyroidism: tsh 0.89. On reduced dose of synthroid through IV due to npo.  Nasal congestion: flonase.  Code Status: full  Family Communication: patient   Disposition Plan: remain inpatient   Consultants:  General surgery  Procedures:    Antibiotics:  Zosyn from 7/10   Objective: BP 120/70 mmHg  Pulse 71  Temp(Src) 98.5 F (36.9 C) (Oral)  Resp 16  Ht 5\' 8"  (1.727 m)  Wt 89.2 kg (196 lb 10.4 oz)  BMI 29.91 kg/m2  SpO2 95%  Intake/Output Summary (Last 24 hours) at 01/18/15 1211 Last data filed at 01/18/15 0900  Gross per 24 hour  Intake      0 ml  Output      0 ml  Net      0 ml   Filed Weights   01/16/15 2022 01/17/15 0149  Weight: 83.462 kg (184 lb) 89.2 kg (196 lb 10.4 oz)    Exam:   General:  NAD  Cardiovascular: RRR  Respiratory: CTABL  Abdomen:  tender lower quadrant, more on the left lower quadrant, no rebound, Soft/ND/NT, positive BS  Musculoskeletal: No Edema  Neuro: aaox3  Data Reviewed: Basic Metabolic Panel:  Recent Labs Lab 01/16/15 2028 01/18/15 0354  NA 136 140  K 3.5 4.3  CL 103 108  CO2 24 26  GLUCOSE 131* 103*  BUN 9 7  CREATININE 0.67 0.68  CALCIUM 9.2 8.7*  MG  --  2.0   Liver Function Tests:  Recent Labs Lab 01/17/15 0032  AST 27  ALT 19  ALKPHOS 48  BILITOT 0.9  PROT 6.9  ALBUMIN 4.3    Recent Labs Lab 01/17/15 0032  LIPASE 20*   No results for input(s): AMMONIA in the last 168 hours. CBC:  Recent  Labs Lab 01/16/15 2028 01/18/15 0354  WBC 14.5* 9.2  HGB 15.1* 12.0  HCT 44.2 36.3  MCV 94.0 95.5  PLT 274 226   Cardiac Enzymes:   No results for input(s): CKTOTAL, CKMB, CKMBINDEX, TROPONINI in the last 168 hours. BNP (last 3 results) No results for input(s): BNP in the last 8760 hours.  ProBNP (last 3 results) No results for input(s): PROBNP in the last 8760 hours.  CBG: No results for input(s): GLUCAP in the last 168 hours.  No results found for this or any previous visit (from the past 240 hour(s)).   Studies: No results found.  Scheduled Meds: . antiseptic oral rinse  7 mL Mouth Rinse BID  . fluticasone  1 spray Each Nare Daily  . heparin  5,000 Units Subcutaneous 3 times per day  . levothyroxine  25 mcg Intravenous Daily  . pantoprazole (PROTONIX) IV  40 mg Intravenous Q24H  . piperacillin-tazobactam (ZOSYN)  IV  3.375 g Intravenous 3 times per day    Continuous Infusions: . sodium chloride 125 mL/hr at 01/18/15 0815     Time spent: 38min  Lannette Avellino MD, PhD  [facility] Pager [phone]. If 7PM-7AM, please contact night-coverage at [email or web address], password [facility] 01/18/2015, 12:11 PM  LOS: 1 day

## 2015-01-18 NOTE — Progress Notes (Signed)
Patient ID: Sylvia Hopkins, female   DOB: 19-Sep-1955, 59 y.o.   MRN: [id number]    Subjective: Pt feels slightly better today, but still with pain.  Some nausea last night, but none this morning.  No BM  Objective: Vital signs in last 24 hours: Temp:  [97.8 F (36.6 C)-98.5 F (36.9 C)] 98.5 F (36.9 C) (07/12 0551) Pulse Rate:  [66-80] 71 (07/12 0551) Resp:  [16-18] 16 (07/12 0551) BP: (99-120)/(63-70) 120/70 mmHg (07/12 0551) SpO2:  [95 %-98 %] 95 % (07/12 0551) Last BM Date: 01/16/15  Intake/Output from previous day:   Intake/Output this shift:    PE: Abd: soft, but still very tender, slightly less so than yesterday, but still with voluntary guarding, +BS, mildly bloated Heart: regular LungS: CTAB  Lab Results:   Recent Labs  01/16/15 2028 01/18/15 0354  WBC 14.5* 9.2  HGB 15.1* 12.0  HCT 44.2 36.3  PLT 274 226   BMET  Recent Labs  01/16/15 2028 01/18/15 0354  NA 136 140  K 3.5 4.3  CL 103 108  CO2 24 26  GLUCOSE 131* 103*  BUN 9 7  CREATININE 0.67 0.68  CALCIUM 9.2 8.7*   PT/INR No results for input(s): LABPROT, INR in the last 72 hours. CMP     Component Value Date/Time   NA 140 01/18/2015 0354   K 4.3 01/18/2015 0354   CL 108 01/18/2015 0354   CO2 26 01/18/2015 0354   GLUCOSE 103* 01/18/2015 0354   BUN 7 01/18/2015 0354   CREATININE 0.68 01/18/2015 0354   CALCIUM 8.7* 01/18/2015 0354   PROT 6.9 01/17/2015 0032   ALBUMIN 4.3 01/17/2015 0032   AST 27 01/17/2015 0032   ALT 19 01/17/2015 0032   ALKPHOS 48 01/17/2015 0032   BILITOT 0.9 01/17/2015 0032   GFRNONAA >60 01/18/2015 0354   GFRAA >60 01/18/2015 0354   Lipase     Component Value Date/Time   LIPASE 20* 01/17/2015 0032       Studies/Results: Dg Chest 2 View  01/16/2015   CLINICAL DATA:  Weakness with abdominal pain and nausea since 02:30 today.  EXAM: CHEST  2 VIEW  COMPARISON:  Left clavicle 10/01/2007  FINDINGS: Slightly shallow inspiration. Normal heart size and  pulmonary vascularity. No focal airspace disease or consolidation in the lungs. No blunting of costophrenic angles. No pneumothorax. Mediastinal contours appear intact. Tortuous aorta. Mild degenerative changes in the spine.  IMPRESSION: No active cardiopulmonary disease.   Electronically Signed   By: Lucienne Capers M.D.   On: 01/16/2015 22:21   Ct Abdomen Pelvis W Contrast  01/17/2015   CLINICAL DATA:  59 year old female with mid abdominal pain  EXAM: CT ABDOMEN AND PELVIS WITH CONTRAST  TECHNIQUE: Multidetector CT imaging of the abdomen and pelvis was performed using the standard protocol following bolus administration of intravenous contrast.  CONTRAST:  137mL OMNIPAQUE IOHEXOL 300 MG/ML  SOLN  COMPARISON:  None.  FINDINGS: The the visualized lung bases are clear.  Small pneumoperitoneum.  No free fluid.  Small scattered hepatic hypodense lesions are not well characterized but likely represent cysts. The gallbladder, pancreas, spleen, adrenal glands, kidneys, visualized ureters, and urinary bladder appear unremarkable. There is a 3.4 x 3.1 cm posterior uterine fibroid. Ultrasound may provide better evaluation of the pelvic structures.  There is extensive sigmoid diverticulosis. There is focal area of inflammatory changes involving the sigmoid colon compatible with diverticulitis. No drainable fluid collection/ abscess identified. Small pockets of air noted throughout the mesentery  and peritoneum compatible with micro perforations.  The visualized abdominal aorta and IVC appear unremarkable. No portal venous gas identified. There is no lymphadenopathy. Small fat containing umbilical hernia. The degenerative changes of the spine. No acute fracture.  IMPRESSION: Perforated sigmoid diverticulitis.  No abscess.  Critical Value/emergent results were called by telephone at the time of interpretation on 01/17/2015 at 12:06 am to Dr. Evelina Bucy , who verbally acknowledged these results.   Electronically Signed   By:  Anner Crete M.D.   On: 01/17/2015 00:06    Anti-infectives: Anti-infectives    Start     Dose/Rate Route Frequency Ordered Stop   01/17/15 0600  piperacillin-tazobactam (ZOSYN) IVPB 3.375 g  Status:  Discontinued     3.375 g 100 mL/hr over 30 Minutes Intravenous 4 times per day 01/17/15 0224 01/17/15 0236   01/17/15 0600  piperacillin-tazobactam (ZOSYN) IVPB 3.375 g     3.375 g 12.5 mL/hr over 240 Minutes Intravenous 3 times per day 01/17/15 0237     01/17/15 0015  piperacillin-tazobactam (ZOSYN) IVPB 3.375 g     3.375 g 100 mL/hr over 30 Minutes Intravenous  Once 01/17/15 0007 01/17/15 0114       Assessment/Plan  1. Diverticulitis with microperforation, no abscess -patient is still very tender today, but slightly less than yesterday.  -WBC has normalized -cont Zosyn D3 -cont NPO x ice chips -follow closely and cont conservative management to try and avoid an operation if possible.  LOS: 1 day    Javonta Gronau E 01/18/2015, 8:19 AM Pager: [phone]

## 2015-01-19 DIAGNOSIS — E039 Hypothyroidism, unspecified: Secondary | ICD-10-CM | POA: Diagnosis present

## 2015-01-19 DIAGNOSIS — K5792 Diverticulitis of intestine, part unspecified, without perforation or abscess without bleeding: Secondary | ICD-10-CM

## 2015-01-19 LAB — BASIC METABOLIC PANEL
Anion gap: 7 (ref 5–15)
BUN: <5 mg/dL — ABNORMAL LOW (ref 6–20)
CHLORIDE: 107 mmol/L (ref 101–111)
CO2: 25 mmol/L (ref 22–32)
CREATININE: 0.64 mg/dL (ref 0.44–1.00)
Calcium: 8.5 mg/dL — ABNORMAL LOW (ref 8.9–10.3)
GFR calc non Af Amer: >60 mL/min (ref >60)
GLUCOSE: 121 mg/dL — AB (ref 65–99)
POTASSIUM: 3.5 mmol/L (ref 3.5–5.1)
Sodium: 139 mmol/L (ref 135–145)

## 2015-01-19 LAB — CBC
HCT: 33.3 % — ABNORMAL LOW (ref 36.0–46.0)
Hemoglobin: 11 g/dL — ABNORMAL LOW (ref 12.0–15.0)
MCH: 31.4 pg (ref 26.0–34.0)
MCHC: 33 g/dL (ref 30.0–36.0)
MCV: 95.1 fL (ref 78.0–100.0)
Platelets: 227 10*3/uL (ref 150–400)
RBC: 3.5 MIL/uL — ABNORMAL LOW (ref 3.87–5.11)
RDW: 14 % (ref 11.5–15.5)
WBC: 6.6 10*3/uL (ref 4.0–10.5)

## 2015-01-19 LAB — PROTIME-INR
INR: 1.09 (ref 0.00–1.49)
PROTHROMBIN TIME: 14.3 s (ref 11.6–15.2)

## 2015-01-19 MED ORDER — SODIUM CHLORIDE 0.9 % IV SOLN: INTRAVENOUS | Status: AC

## 2015-01-19 NOTE — Progress Notes (Signed)
[facility] PROGRESS NOTE  Sylvia Hopkins KXF:[id number] DOB: 1955-11-04 DOA: 01/16/2015  PCP: Reginia Naas, MD  Brief HPI: 59 year old Caucasian female with a past medical history of hypothyroidism, presented with complaints of lower abdominal pain. She was found to have acute diverticulitis with perforation. Patient was hospitalized for further management  Past medical history:  Past Medical History  Diagnosis Date  . Thyroid disease   . Diverticula of colon     Consultants: Gen. surgery  Procedures: None yet  Antibiotics: Zosyn  Subjective: Patient feels better. Pain is improving. Denies any nausea, vomiting. Passing gas from below.  Objective: Vital Signs  Filed Vitals:   01/18/15 1428 01/18/15 2258 01/19/15 0505 01/19/15 1342  BP: 148/81 116/71 125/73 148/89  Pulse: 88 73 72 78  Temp: 98.8 F (37.1 C) 98.4 F (36.9 C) 99 F (37.2 C) 98.4 F (36.9 C)  TempSrc: Oral Oral Oral Oral  Resp: 18 18 18    Height:      Weight:      SpO2: 98% 94% 95% 99%    Intake/Output Summary (Last 24 hours) at 01/19/15 1523 Last data filed at 01/19/15 1400  Gross per 24 hour  Intake   2201 ml  Output      0 ml  Net   2201 ml   Filed Weights   01/16/15 2022 01/17/15 0149  Weight: 83.462 kg (184 lb) 89.2 kg (196 lb 10.4 oz)    General appearance: alert, cooperative, appears stated age and no distress Resp: clear to auscultation bilaterally Cardio: regular rate and rhythm, S1, S2 normal, no murmur, click, rub or gallop GI: Soft. Mildly tender in the lower cord remains mild, left more than right. Bowel sounds are present. No masses or organomegaly. No rebound, rigidity or guarding. Extremities: extremities normal, atraumatic, no cyanosis or edema Neurologic: Alert and oriented 3. Cranial nerves II-12 intact. Motor strength normal bilaterally. Upper and lower extremities.  Lab Results:  Basic Metabolic Panel:  Recent Labs Lab 01/16/15 2028  01/18/15 0354 01/19/15 0330  NA 136 140 139  K 3.5 4.3 3.5  CL 103 108 107  CO2 24 26 25   GLUCOSE 131* 103* 121*  BUN 9 7 <5*  CREATININE 0.67 0.68 0.64  CALCIUM 9.2 8.7* 8.5*  MG  --  2.0  --    Liver Function Tests:  Recent Labs Lab 01/17/15 0032  AST 27  ALT 19  ALKPHOS 48  BILITOT 0.9  PROT 6.9  ALBUMIN 4.3    Recent Labs Lab 01/17/15 0032  LIPASE 20*   CBC:  Recent Labs Lab 01/16/15 2028 01/18/15 0354 01/19/15 0330  WBC 14.5* 9.2 6.6  HGB 15.1* 12.0 11.0*  HCT 44.2 36.3 33.3*  MCV 94.0 95.5 95.1  PLT 274 226 227    Studies/Results: No results found.  Medications:  Scheduled: . antiseptic oral rinse  7 mL Mouth Rinse BID  . fluticasone  1 spray Each Nare Daily  . heparin  5,000 Units Subcutaneous 3 times per day  . levothyroxine  25 mcg Intravenous Daily  . pantoprazole (PROTONIX) IV  40 mg Intravenous Q24H  . piperacillin-tazobactam (ZOSYN)  IV  3.375 g Intravenous 3 times per day   Continuous: . sodium chloride     IRC:VELFYBOFB, HYDROmorphone (DILAUDID) injection, LORazepam, ondansetron (ZOFRAN) IV  Assessment/Plan:  Principal Problem:   Acute diverticulitis Active Problems:   Diverticulitis   Hypothyroidism    Acute diverticulitis with perforation General surgeries following. Currently being managed conservatively. Continue Zosyn.  Pain medications as needed. Diet being advanced by general surgery.  History of hypothyroidism Currently on IV Synthroid. Can be changed to oral eventually.  DVT Prophylaxis: Subcutaneous heparin    Code Status: Full code  Family Communication: Discussed with the patient and her daughter  Disposition Plan: Await improvement. Mobilize.  Follow-up Appointment?: With her PCP and with general surgery   LOS: 2 days   [facility] Pager [phone] 01/19/2015, 3:23 PM  If 7PM-7AM, please contact night-coverage at [email or web address], password [facility]

## 2015-01-19 NOTE — Progress Notes (Signed)
Patient ID: Sylvia Hopkins, female   DOB: Mar 02, 1956, 59 y.o.   MRN: [id number]   LOS: 2 days   Subjective: Feels better, pain improved. One e/o nausea with a tiny bit of emesis last night but otherwise tolerated clears well. Feels like she need to have BM now.   Objective: Vital signs in last 24 hours: Temp:  [98.4 F (36.9 C)-99 F (37.2 C)] 99 F (37.2 C) (07/13 0505) Pulse Rate:  [72-88] 72 (07/13 0505) Resp:  [18] 18 (07/13 0505) BP: (116-148)/(71-81) 125/73 mmHg (07/13 0505) SpO2:  [94 %-98 %] 95 % (07/13 0505) Last BM Date: 01/16/15   Laboratory  CBC  Recent Labs  01/18/15 0354 01/19/15 0330  WBC 9.2 6.6  HGB 12.0 11.0*  HCT 36.3 33.3*  PLT 226 227   BMET  Recent Labs  01/18/15 0354 01/19/15 0330  NA 140 139  K 4.3 3.5  CL 108 107  CO2 26 25  GLUCOSE 103* 121*  BUN 7 <5*  CREATININE 0.68 0.64  CALCIUM 8.7* 8.5*    Physical Exam General appearance: alert and no distress Resp: clear to auscultation bilaterally Cardio: regular rate and rhythm GI: normal findings: bowel sounds normal and soft, non-tender   Assessment/Plan: Diverticulitis with microperforation, no abscess -patient is much better today -WBC has normalized -cont Zosyn D4 -Advance diet to fulls -follow closely and cont conservative management to try and avoid an operation if possible.    Lisette Abu, PA-C Pager: [phone] 01/19/2015

## 2015-01-20 LAB — BASIC METABOLIC PANEL
Anion gap: 5 (ref 5–15)
BUN: <5 mg/dL — ABNORMAL LOW (ref 6–20)
CO2: 28 mmol/L (ref 22–32)
Calcium: 9.1 mg/dL (ref 8.9–10.3)
Chloride: 109 mmol/L (ref 101–111)
Creatinine, Ser: 0.74 mg/dL (ref 0.44–1.00)
GFR calc Af Amer: >60 mL/min (ref >60)
GFR calc non Af Amer: >60 mL/min (ref >60)
Glucose, Bld: 113 mg/dL — ABNORMAL HIGH (ref 65–99)
Potassium: 3.5 mmol/L (ref 3.5–5.1)
SODIUM: 142 mmol/L (ref 135–145)

## 2015-01-20 LAB — CBC
HCT: 35.5 % — ABNORMAL LOW (ref 36.0–46.0)
Hemoglobin: 12 g/dL (ref 12.0–15.0)
MCH: 31.7 pg (ref 26.0–34.0)
MCHC: 33.8 g/dL (ref 30.0–36.0)
MCV: 93.7 fL (ref 78.0–100.0)
PLATELETS: 254 10*3/uL (ref 150–400)
RBC: 3.79 MIL/uL — AB (ref 3.87–5.11)
RDW: 13.5 % (ref 11.5–15.5)
WBC: 5 10*3/uL (ref 4.0–10.5)

## 2015-01-20 MED ORDER — LEVOTHYROXINE SODIUM 50 MCG PO TABS
75.0000 ug | ORAL_TABLET | Freq: Every day | ORAL | Status: DC
  Administered 2015-01-21: 75 ug via ORAL
  Filled 2015-01-20 (×2): qty 1

## 2015-01-20 MED ORDER — PANTOPRAZOLE SODIUM 40 MG PO TBEC: 40.0000 mg | DELAYED_RELEASE_TABLET | Freq: Every day | ORAL | Status: DC

## 2015-01-20 MED ORDER — AMOXICILLIN-POT CLAVULANATE 875-125 MG PO TABS
1.0000 | ORAL_TABLET | Freq: Two times a day (BID) | ORAL | Status: DC
  Administered 2015-01-20 (×2): 1 via ORAL
  Filled 2015-01-20 (×2): qty 1

## 2015-01-20 NOTE — Progress Notes (Signed)
[facility] Progress Note     Subjective: Pt tolerating fulls, no pain, no N/V.  Having flatus and BM's.  Ambulating well.  Wants to go home soon.  Would like to talk to dietitian.  Husband at bedside with lots of questions.  Objective: Vital signs in last 24 hours: Temp:  [98.4 F (36.9 C)-99 F (37.2 C)] 98.8 F (37.1 C) (07/14 0515) Pulse Rate:  [65-78] 65 (07/14 0515) Resp:  [16-18] 18 (07/14 0515) BP: (133-148)/(82-89) 133/85 mmHg (07/14 0515) SpO2:  [94 %-99 %] 94 % (07/14 0515) Last BM Date: 01/19/15  Intake/Output from previous day: 07/13 0701 - 07/14 0700 In: 1045.5 [P.O.:838; I.V.:107.5; IV Piggyback:100] Out: -  Intake/Output this shift:    PE: Gen:  Alert, NAD, pleasant Abd: Soft, NT/ND, +BS, no HSM   Lab Results:   Recent Labs  01/19/15 0330 01/20/15 0345  WBC 6.6 5.0  HGB 11.0* 12.0  HCT 33.3* 35.5*  PLT 227 254   BMET  Recent Labs  01/19/15 0330 01/20/15 0345  NA 139 142  K 3.5 3.5  CL 107 109  CO2 25 28  GLUCOSE 121* 113*  BUN <5* <5*  CREATININE 0.64 0.74  CALCIUM 8.5* 9.1   PT/INR  Recent Labs  01/19/15 0330  LABPROT 14.3  INR 1.09   CMP     Component Value Date/Time   NA 142 01/20/2015 0345   K 3.5 01/20/2015 0345   CL 109 01/20/2015 0345   CO2 28 01/20/2015 0345   GLUCOSE 113* 01/20/2015 0345   BUN <5* 01/20/2015 0345   CREATININE 0.74 01/20/2015 0345   CALCIUM 9.1 01/20/2015 0345   PROT 6.9 01/17/2015 0032   ALBUMIN 4.3 01/17/2015 0032   AST 27 01/17/2015 0032   ALT 19 01/17/2015 0032   ALKPHOS 48 01/17/2015 0032   BILITOT 0.9 01/17/2015 0032   GFRNONAA >60 01/20/2015 0345   GFRAA >60 01/20/2015 0345   Lipase     Component Value Date/Time   LIPASE 20* 01/17/2015 0032       Studies/Results: No results found.  Anti-infectives: Anti-infectives    Start     Dose/Rate Route Frequency Ordered Stop   01/17/15 0600  piperacillin-tazobactam (ZOSYN) IVPB 3.375 g  Status:  Discontinued     3.375 g 100 mL/hr over 30 Minutes Intravenous 4 times per day 01/17/15 0224 01/17/15 0236   01/17/15 0600  piperacillin-tazobactam (ZOSYN) IVPB 3.375 g     3.375 g 12.5 mL/hr over 240 Minutes Intravenous 3 times per day 01/17/15 0237     01/17/15 0015  piperacillin-tazobactam (ZOSYN) IVPB 3.375 g     3.375 g 100 mL/hr over 30 Minutes Intravenous  Once 01/17/15 0007 01/17/15 0114       Assessment/Plan Diverticulitis with microperforation, no abscess -HD #4 -Patient is much better today, no pain, leukocytosis resolved for 3rd day, no fevers -Cont Zosyn 4/4 days, switch to Augmentin, likely needs a total of 10 day course -Advance to soft diet -Dietitian consult -Home today or tomorrow if tolerating solid diet.    LOS: 3 days    Nat Christen 01/20/2015, 8:45 AM Pager: [phone]

## 2015-01-20 NOTE — Progress Notes (Signed)
[facility] PROGRESS NOTE  Sylvia Hopkins HYI:[id number] DOB: 1956/03/25 DOA: 01/16/2015  PCP: Reginia Naas, MD  Brief HPI: 59 year old Caucasian female with a past medical history of hypothyroidism, presented with complaints of lower abdominal pain. She was found to have acute diverticulitis with perforation. Patient was hospitalized for further management.  Past medical history:  Past Medical History  Diagnosis Date  . Thyroid disease   . Diverticula of colon     Consultants: Gen. surgery  Procedures: None  Antibiotics: Zosyn changed to Augmentin today  Subjective: Patient continues to feel better. Tolerating her diet. Denies any nausea, vomiting. Passing gas.   Objective: Vital Signs  Filed Vitals:   01/19/15 0505 01/19/15 1342 01/19/15 2207 01/20/15 0515  BP: 125/73 148/89 139/82 133/85  Pulse: 72 78 69 65  Temp: 99 F (37.2 C) 98.4 F (36.9 C) 99 F (37.2 C) 98.8 F (37.1 C)  TempSrc: Oral Oral Oral Oral  Resp: 18  16 18   Height:      Weight:      SpO2: 95% 99% 98% 94%    Intake/Output Summary (Last 24 hours) at 01/20/15 0941 Last data filed at 01/20/15 0600  Gross per 24 hour  Intake  805.5 ml  Output      0 ml  Net  805.5 ml   Filed Weights   01/16/15 2022 01/17/15 0149  Weight: 83.462 kg (184 lb) 89.2 kg (196 lb 10.4 oz)    General appearance: alert, cooperative, appears stated age and no distress Resp: clear to auscultation bilaterally Cardio: regular rate and rhythm, S1, S2 normal, no murmur, click, rub or gallop GI: Soft. Mildly tender in the lower quadrants, left more than right. Bowel sounds are present. No masses or organomegaly. No rebound, rigidity or guarding. Extremities: extremities normal, atraumatic, no cyanosis or edema Neurologic: Alert and oriented 3. Cranial nerves II-12 intact. Motor strength normal bilaterally. Upper and lower extremities.  Lab Results:  Basic Metabolic Panel:  Recent Labs Lab  01/16/15 2028 01/18/15 0354 01/19/15 0330 01/20/15 0345  NA 136 140 139 142  K 3.5 4.3 3.5 3.5  CL 103 108 107 109  CO2 24 26 25 28   GLUCOSE 131* 103* 121* 113*  BUN 9 7 <5* <5*  CREATININE 0.67 0.68 0.64 0.74  CALCIUM 9.2 8.7* 8.5* 9.1  MG  --  2.0  --   --    Liver Function Tests:  Recent Labs Lab 01/17/15 0032  AST 27  ALT 19  ALKPHOS 48  BILITOT 0.9  PROT 6.9  ALBUMIN 4.3    Recent Labs Lab 01/17/15 0032  LIPASE 20*   CBC:  Recent Labs Lab 01/16/15 2028 01/18/15 0354 01/19/15 0330 01/20/15 0345  WBC 14.5* 9.2 6.6 5.0  HGB 15.1* 12.0 11.0* 12.0  HCT 44.2 36.3 33.3* 35.5*  MCV 94.0 95.5 95.1 93.7  PLT 274 226 227 254    Studies/Results: No results found.  Medications:  Scheduled: . amoxicillin-clavulanate  1 tablet Oral Q12H  . antiseptic oral rinse  7 mL Mouth Rinse BID  . fluticasone  1 spray Each Nare Daily  . heparin  5,000 Units Subcutaneous 3 times per day  . levothyroxine  25 mcg Intravenous Daily  . pantoprazole (PROTONIX) IV  40 mg Intravenous Q24H   Continuous: . sodium chloride 10 mL/hr at 01/19/15 1915   NOM:VEHMCNOBS, HYDROmorphone (DILAUDID) injection, LORazepam, ondansetron (ZOFRAN) IV  Assessment/Plan:  Principal Problem:   Acute diverticulitis Active Problems:   Diverticulitis  Hypothyroidism    Acute diverticulitis with perforation General surgery is following. Currently being managed conservatively. Diet is being advanced. Antibiotics changed to oral. Patient is improving. She will eventually need to be seen by gastroenterology for a colonoscopy at some point.  History of hypothyroidism Change to her oral Synthroid.   DVT Prophylaxis: Subcutaneous heparin    Code Status: Full code  Family Communication: Discussed with the patient and her husband Disposition Plan: Continue to ambulate. Anticipate discharge tomorrow.  Follow-up Appointment?: With her PCP and with general surgery   LOS: 3 days    [facility] Pager [phone] 01/20/2015, 9:41 AM  If 7PM-7AM, please contact night-coverage at [email or web address], password [facility]

## 2015-01-20 NOTE — Plan of Care (Signed)
Problem: Food- and Nutrition-Related Knowledge Deficit (NB-1.1) Goal: Nutrition education Formal process to instruct or train a patient/client in a skill or to impart knowledge to help patients/clients voluntarily manage or modify food choices and eating behavior to maintain or improve health. Outcome: Adequate for Discharge Nutrition Education Note  RD consulted for nutrition education regarding diverticulitis.  RD provided "Fiber Restricted Nutrition Therapy" handout from the Academy of [employer]. Reviewed patient's dietary recall. Provided examples on ways to decrease fiber intake in diet. Discouraged fresh fruits and vegetables as well as whole grain sources of carbohydrates to minimize fiber intake.   RD discussed why it is important for patient to adhere to diet recommendations, and emphasized slowly increasing high fiber foods back into diet to prevent future flare-ups. Teach back method used.  Expect good compliance.  Body mass index is 29.91 kg/(m^2). Pt meets criteria for overweight based on current BMI.  Current diet order is soft, patient is consuming approximately 50% of meals at this time. Labs and medications reviewed. No further nutrition interventions warranted at this time. RD contact information provided. If additional nutrition issues arise, please re-consult RD.   Lisset Ketchem A. Jimmye Norman, RD, LDN, CDE Pager: [phone] After hours Pager: [phone]

## 2015-01-21 MED ORDER — AMOXICILLIN-POT CLAVULANATE 875-125 MG PO TABS: 1.0000 | ORAL_TABLET | Freq: Two times a day (BID) | ORAL | Status: DC

## 2015-01-21 NOTE — Discharge Summary (Signed)
[facility]  Physician Discharge Summary   Patient ID: Sylvia Hopkins MRN: [id number] DOB/AGE: 1956/01/10 59 y.o.  Admit date: 01/16/2015 Discharge date: 01/21/2015  PCP: Reginia Naas, MD  DISCHARGE DIAGNOSES:  Principal Problem:   Acute diverticulitis Active Problems:   Diverticulitis   Hypothyroidism   RECOMMENDATIONS FOR OUTPATIENT FOLLOW UP: 1. Patient recommended to follow-up with her primary care physician to discuss referral to gastroenterology for colonoscopy in the next 1-2 months.   DISCHARGE CONDITION: fair  Diet recommendation: Soft diet  Filed Weights   01/16/15 2022 01/17/15 0149  Weight: 83.462 kg (184 lb) 89.2 kg (196 lb 10.4 oz)    INITIAL HISTORY: 59 year old Caucasian female with a past medical history of hypothyroidism, presented with complaints of lower abdominal pain. She was found to have acute diverticulitis with perforation. Patient was hospitalized for further management.  Consultations: General surgery   HOSPITAL COURSE:   Acute diverticulitis with perforation Patient was admitted to the hospital. She was followed by general surgery. She was managed conservatively. She was placed on IV antibiotics. Diet was slowly advanced. Antibiotics were changed to oral. Patient started having bowel movements. Her pain subsided. She has been ambulating. Cleared for discharge today. She will eventually need to be seen by gastroenterology for a colonoscopy at some point. He will discuss this further with her primary care physician.  History of hypothyroidism Continue Synthroid   PERTINENT LABS:  The results of significant diagnostics from this hospitalization (including imaging, microbiology, ancillary and laboratory) are listed below for reference.     Labs: Basic Metabolic Panel:  Recent Labs Lab 01/16/15 2028 01/18/15 0354 01/19/15 0330 01/20/15 0345  NA 136 140 139 142  K 3.5 4.3 3.5 3.5  CL 103 108 107 109  CO2 24 26  25 28   GLUCOSE 131* 103* 121* 113*  BUN 9 7 <5* <5*  CREATININE 0.67 0.68 0.64 0.74  CALCIUM 9.2 8.7* 8.5* 9.1  MG  --  2.0  --   --    Liver Function Tests:  Recent Labs Lab 01/17/15 0032  AST 27  ALT 19  ALKPHOS 48  BILITOT 0.9  PROT 6.9  ALBUMIN 4.3    Recent Labs Lab 01/17/15 0032  LIPASE 20*   CBC:  Recent Labs Lab 01/16/15 2028 01/18/15 0354 01/19/15 0330 01/20/15 0345  WBC 14.5* 9.2 6.6 5.0  HGB 15.1* 12.0 11.0* 12.0  HCT 44.2 36.3 33.3* 35.5*  MCV 94.0 95.5 95.1 93.7  PLT 274 226 227 254     IMAGING STUDIES Dg Chest 2 View  01/16/2015   CLINICAL DATA:  Weakness with abdominal pain and nausea since 02:30 today.  EXAM: CHEST  2 VIEW  COMPARISON:  Left clavicle 10/01/2007  FINDINGS: Slightly shallow inspiration. Normal heart size and pulmonary vascularity. No focal airspace disease or consolidation in the lungs. No blunting of costophrenic angles. No pneumothorax. Mediastinal contours appear intact. Tortuous aorta. Mild degenerative changes in the spine.  IMPRESSION: No active cardiopulmonary disease.   Electronically Signed   By: Lucienne Capers M.D.   On: 01/16/2015 22:21   Ct Abdomen Pelvis W Contrast  01/17/2015   CLINICAL DATA:  59 year old female with mid abdominal pain  EXAM: CT ABDOMEN AND PELVIS WITH CONTRAST  TECHNIQUE: Multidetector CT imaging of the abdomen and pelvis was performed using the standard protocol following bolus administration of intravenous contrast.  CONTRAST:  122mL OMNIPAQUE IOHEXOL 300 MG/ML  SOLN  COMPARISON:  None.  FINDINGS: The the visualized lung bases are clear.  Small pneumoperitoneum.  No free fluid.  Small scattered hepatic hypodense lesions are not well characterized but likely represent cysts. The gallbladder, pancreas, spleen, adrenal glands, kidneys, visualized ureters, and urinary bladder appear unremarkable. There is a 3.4 x 3.1 cm posterior uterine fibroid. Ultrasound may provide better evaluation of the pelvic  structures.  There is extensive sigmoid diverticulosis. There is focal area of inflammatory changes involving the sigmoid colon compatible with diverticulitis. No drainable fluid collection/ abscess identified. Small pockets of air noted throughout the mesentery and peritoneum compatible with micro perforations.  The visualized abdominal aorta and IVC appear unremarkable. No portal venous gas identified. There is no lymphadenopathy. Small fat containing umbilical hernia. The degenerative changes of the spine. No acute fracture.  IMPRESSION: Perforated sigmoid diverticulitis.  No abscess.  Critical Value/emergent results were called by telephone at the time of interpretation on 01/17/2015 at 12:06 am to Dr. Evelina Bucy , who verbally acknowledged these results.   Electronically Signed   By: Anner Crete M.D.   On: 01/17/2015 00:06    DISCHARGE EXAMINATION: Filed Vitals:   01/20/15 0515 01/20/15 1349 01/20/15 2115 01/21/15 0505  BP: 133/85 155/95 139/86 140/87  Pulse: 65 67 84 75  Temp: 98.8 F (37.1 C) 97.9 F (36.6 C) 98.4 F (36.9 C) 98.4 F (36.9 C)  TempSrc: Oral Oral Oral Oral  Resp: 18 18 16 16   Height:      Weight:      SpO2: 94% 98% 96% 95%   General appearance: alert, cooperative, appears stated age and no distress Resp: clear to auscultation bilaterally Cardio: regular rate and rhythm, S1, S2 normal, no murmur, click, rub or gallop GI: soft, non-tender; bowel sounds normal; no masses,  no organomegaly  DISPOSITION: Home  Discharge Instructions    Call MD for:  difficulty breathing, headache or visual disturbances    Complete by:  As directed      Call MD for:  extreme fatigue    Complete by:  As directed      Call MD for:  persistant dizziness or light-headedness    Complete by:  As directed      Call MD for:  persistant nausea and vomiting    Complete by:  As directed      Call MD for:  severe uncontrolled pain    Complete by:  As directed      Call MD for:   temperature >100.4    Complete by:  As directed      Discharge diet:    Complete by:  As directed   Soft diet for 3 days and then advance. Avoid fried, oily foods. Increase fiber in your diet. Avoid constipation as much as possible.     Discharge instructions    Complete by:  As directed   Please follow up with the surgeon and PCP as instructed. Seek attention if you have recurrence of your symptoms.  You were cared for by a hospitalist during your hospital stay. If you have any questions about your discharge medications or the care you received while you were in the hospital after you are discharged, you can call the unit and asked to speak with the hospitalist on call if the hospitalist that took care of you is not available. Once you are discharged, your primary care physician will handle any further medical issues. Please note that NO REFILLS for any discharge medications will be authorized once you are discharged, as it is imperative that you return to your  primary care physician (or establish a relationship with a primary care physician if you do not have one) for your aftercare needs so that they can reassess your need for medications and monitor your lab values. If you do not have a primary care physician, you can call [phone] for a physician referral.     Increase activity slowly    Complete by:  As directed            ALLERGIES:  Allergies  Allergen Reactions  . Advil [Ibuprofen] Other (See Comments)    Upset stomach      Discharge Medication List as of 01/21/2015  8:59 AM    START taking these medications   Details  amoxicillin-clavulanate (AUGMENTIN) 875-125 MG per tablet Take 1 tablet by mouth every 12 (twelve) hours. For 10 more days, Starting 01/21/2015, Until Discontinued, Print      CONTINUE these medications which have NOT CHANGED   Details  albuterol (PROVENTIL HFA;VENTOLIN HFA) 108 (90 BASE) MCG/ACT inhaler Inhale 2 puffs into the lungs every 6 (six) hours as  needed for wheezing or shortness of breath., Until Discontinued, Historical Med    ALPRAZolam (XANAX) 0.25 MG tablet Take 0.25 mg by mouth at bedtime as needed for anxiety., Until Discontinued, Historical Med    amphetamine-dextroamphetamine (ADDERALL) 30 MG tablet Take 30 mg by mouth See admin instructions. Takes Monday-Friday, Until Discontinued, Historical Med    azelastine (ASTELIN) 0.1 % nasal spray Place 1 spray into both nostrils 2 (two) times daily. Use in each nostril as directed, Until Discontinued, Historical Med    estradiol-norethindrone (COMBIPATCH) 0.05-0.14 MG/DAY Place 1 patch onto the skin 2 (two) times a week., Until Discontinued, Historical Med    Fexofenadine HCl (ALLEGRA PO) Take 1 tablet by mouth every 12 (twelve) hours., Until Discontinued, Historical Med    levothyroxine (SYNTHROID, LEVOTHROID) 75 MCG tablet Take 75 mcg by mouth daily before breakfast., Until Discontinued, Historical Med       Follow-up Information    Follow up with [facility] A, MD. Schedule an appointment as soon as possible for a visit in 2 weeks.   Specialty:  General Surgery   Why:  For post-hospital follow up   Contact information:   [address] [place] [address] [phone]       Follow up with Reginia Naas, MD. Schedule an appointment as soon as possible for a visit in 1 week.   Specialty:  Family Medicine   Why:  to discuss referral to gastrenterology for colonoscopy within next 2 months.   Contact information:   [place] [address] [phone]       TOTAL DISCHARGE TIME: 85 minutes  [facility] Pager [phone]  01/21/2015, 1:27 PM

## 2015-01-21 NOTE — Progress Notes (Signed)
[facility] Progress Note     Subjective: Pt doing very well, getting ready to go home.  No N/V or pain.  Feels good.  Dietitian came by yesterday.  Objective: Vital signs in last 24 hours: Temp:  [97.9 F (36.6 C)-98.4 F (36.9 C)] 98.4 F (36.9 C) (07/15 0505) Pulse Rate:  [67-84] 75 (07/15 0505) Resp:  [16-18] 16 (07/15 0505) BP: (139-155)/(86-95) 140/87 mmHg (07/15 0505) SpO2:  [95 %-98 %] 95 % (07/15 0505) Last BM Date: 01/19/15  Intake/Output from previous day: 07/14 0701 - 07/15 0700 In: 360 [P.O.:360] Out: -  Intake/Output this shift:    PE: Gen:  Alert, NAD, pleasant Abd: Soft, NT/ND, +BS, no HSM   Lab Results:   Recent Labs  01/19/15 0330 01/20/15 0345  WBC 6.6 5.0  HGB 11.0* 12.0  HCT 33.3* 35.5*  PLT 227 254   BMET  Recent Labs  01/19/15 0330 01/20/15 0345  NA 139 142  K 3.5 3.5  CL 107 109  CO2 25 28  GLUCOSE 121* 113*  BUN <5* <5*  CREATININE 0.64 0.74  CALCIUM 8.5* 9.1   PT/INR  Recent Labs  01/19/15 0330  LABPROT 14.3  INR 1.09   CMP     Component Value Date/Time   NA 142 01/20/2015 0345   K 3.5 01/20/2015 0345   CL 109 01/20/2015 0345   CO2 28 01/20/2015 0345   GLUCOSE 113* 01/20/2015 0345   BUN <5* 01/20/2015 0345   CREATININE 0.74 01/20/2015 0345   CALCIUM 9.1 01/20/2015 0345   PROT 6.9 01/17/2015 0032   ALBUMIN 4.3 01/17/2015 0032   AST 27 01/17/2015 0032   ALT 19 01/17/2015 0032   ALKPHOS 48 01/17/2015 0032   BILITOT 0.9 01/17/2015 0032   GFRNONAA >60 01/20/2015 0345   GFRAA >60 01/20/2015 0345   Lipase     Component Value Date/Time   LIPASE 20* 01/17/2015 0032       Studies/Results: No results found.  Anti-infectives: Anti-infectives    Start     Dose/Rate Route Frequency Ordered Stop   01/20/15 1000  amoxicillin-clavulanate (AUGMENTIN) 875-125 MG per tablet 1 tablet     1 tablet Oral Every 12 hours 01/20/15 0920 01/30/15 0959   01/17/15 0600  piperacillin-tazobactam (ZOSYN) IVPB  3.375 g  Status:  Discontinued     3.375 g 100 mL/hr over 30 Minutes Intravenous 4 times per day 01/17/15 0224 01/17/15 0236   01/17/15 0600  piperacillin-tazobactam (ZOSYN) IVPB 3.375 g  Status:  Discontinued     3.375 g 12.5 mL/hr over 240 Minutes Intravenous 3 times per day 01/17/15 0237 01/20/15 0920   01/17/15 0015  piperacillin-tazobactam (ZOSYN) IVPB 3.375 g     3.375 g 100 mL/hr over 30 Minutes Intravenous  Once 01/17/15 0007 01/17/15 0114       Assessment/Plan Diverticulitis with microperforation, no abscess -HD #5 -Patient is much better today, no pain, leukocytosis resolved, no fevers -Finished Zosyn 4/4 days, switch to Augmentin, likely needs a total of 10 day course -Tolerating soft diet -Dietitian advised her yesterday -Home today -Follow up with [place] in the office in 2-3 weeks -Will need colonoscopy in 6-8 weeks from now    LOS: 4 days    Nat Christen 01/21/2015, 8:26 AM Pager: [phone]

## 2015-01-21 NOTE — Progress Notes (Signed)
Patient discharged to home with instructions. 

## 2015-01-21 NOTE — Discharge Instructions (Signed)
Diverticulitis Diverticulitis is inflammation or infection of small pouches in your colon that form when you have a condition called diverticulosis. The pouches in your colon are called diverticula. Your colon, or large intestine, is where water is absorbed and stool is formed. Complications of diverticulitis can include:  Bleeding.  Severe infection.  Severe pain.  Perforation of your colon.  Obstruction of your colon. CAUSES  Diverticulitis is caused by bacteria. Diverticulitis happens when stool becomes trapped in diverticula. This allows bacteria to grow in the diverticula, which can lead to inflammation and infection. RISK FACTORS People with diverticulosis are at risk for diverticulitis. Eating a diet that does not include enough fiber from fruits and vegetables may make diverticulitis more likely to develop. SYMPTOMS  Symptoms of diverticulitis may include:  Abdominal pain and tenderness. The pain is normally located on the left side of the abdomen, but may occur in other areas.  Fever and chills.  Bloating.  Cramping.  Nausea.  Vomiting.  Constipation.  Diarrhea.  Blood in your stool. DIAGNOSIS  Your health care provider will ask you about your medical history and do a physical exam. You may need to have tests done because many medical conditions can cause the same symptoms as diverticulitis. Tests may include:  Blood tests.  Urine tests.  Imaging tests of the abdomen, including X-rays and CT scans. When your condition is under control, your health care provider may recommend that you have a colonoscopy. A colonoscopy can show how severe your diverticula are and whether something else is causing your symptoms. TREATMENT  Most cases of diverticulitis are mild and can be treated at home. Treatment may include:  Taking over-the-counter pain medicines.  Following a clear liquid diet.  Taking antibiotic medicines by mouth for 7-10 days. More severe cases may  be treated at a hospital. Treatment may include:  Not eating or drinking.  Taking prescription pain medicine.  Receiving antibiotic medicines through an IV tube.  Receiving fluids and nutrition through an IV tube.  Surgery. HOME CARE INSTRUCTIONS   Follow your health care provider's instructions carefully.  Follow a full liquid diet or other diet as directed by your health care provider. After your symptoms improve, your health care provider may tell you to change your diet. He or she may recommend you eat a high-fiber diet. Fruits and vegetables are good sources of fiber. Fiber makes it easier to pass stool.  Take fiber supplements or probiotics as directed by your health care provider.  Only take medicines as directed by your health care provider.  Keep all your follow-up appointments. SEEK MEDICAL CARE IF:   Your pain does not improve.  You have a hard time eating food.  Your bowel movements do not return to normal. SEEK IMMEDIATE MEDICAL CARE IF:   Your pain becomes worse.  Your symptoms do not get better.  Your symptoms suddenly get worse.  You have a fever.  You have repeated vomiting.  You have bloody or black, tarry stools. MAKE SURE YOU:   Understand these instructions.  Will watch your condition.  Will get help right away if you are not doing well or get worse. Document Released: 04/04/2005 Document Revised: 06/30/2013 Document Reviewed: 05/20/2013 [facility] Patient Information 2015 [place], [place]. This information is not intended to replace advice given to you by your health care provider. Make sure you discuss any questions you have with your health care provider.   High-Fiber Diet Fiber is found in fruits, vegetables, and grains. A high-fiber  diet encourages the addition of more whole grains, legumes, fruits, and vegetables in your diet. The recommended amount of fiber for adult males is 38 g per day. For adult females, it is 25 g per day. Pregnant  and lactating women should get 28 g of fiber per day. If you have a digestive or bowel problem, ask your caregiver for advice before adding high-fiber foods to your diet. Eat a variety of high-fiber foods instead of only a select few type of foods.  PURPOSE  To increase stool bulk.  To make bowel movements more regular to prevent constipation.  To lower cholesterol.  To prevent overeating. WHEN IS THIS DIET USED?  It may be used if you have constipation and hemorrhoids.  It may be used if you have uncomplicated diverticulosis (intestine condition) and irritable bowel syndrome.  It may be used if you need help with weight management.  It may be used if you want to add it to your diet as a protective measure against atherosclerosis, diabetes, and cancer. SOURCES OF FIBER  Whole-grain breads and cereals.  Fruits, such as apples, oranges, bananas, berries, prunes, and pears.  Vegetables, such as green peas, carrots, sweet potatoes, beets, broccoli, cabbage, spinach, and artichokes.  Legumes, such split peas, soy, lentils.  Almonds. FIBER CONTENT IN FOODS Starches and Grains / Dietary Fiber (g)  Cheerios, 1 cup / 3 g  Corn Flakes cereal, 1 cup / 0.7 g  Rice crispy treat cereal, 1 cup / 0.3 g  Instant oatmeal (cooked),  cup / 2 g  Frosted wheat cereal, 1 cup / 5.1 g  Brown, long-grain rice (cooked), 1 cup / 3.5 g  White, long-grain rice (cooked), 1 cup / 0.6 g  Enriched macaroni (cooked), 1 cup / 2.5 g Legumes / Dietary Fiber (g)  Baked beans (canned, plain, or vegetarian),  cup / 5.2 g  Kidney beans (canned),  cup / 6.8 g  Pinto beans (cooked),  cup / 5.5 g Breads and Crackers / Dietary Fiber (g)  Plain or honey graham crackers, 2 squares / 0.7 g  Saltine crackers, 3 squares / 0.3 g  Plain, salted pretzels, 10 pieces / 1.8 g  Whole-wheat bread, 1 slice / 1.9 g  White bread, 1 slice / 0.7 g  Raisin bread, 1 slice / 1.2 g  Plain bagel, 3 oz / 2  g  Flour tortilla, 1 oz / 0.9 g  Corn tortilla, 1 small / 1.5 g  Hamburger or hotdog bun, 1 small / 0.9 g Fruits / Dietary Fiber (g)  Apple with skin, 1 medium / 4.4 g  Sweetened applesauce,  cup / 1.5 g  Banana,  medium / 1.5 g  Grapes, 10 grapes / 0.4 g  Orange, 1 small / 2.3 g  Raisin, 1.5 oz / 1.6 g  Melon, 1 cup / 1.4 g Vegetables / Dietary Fiber (g)  Green beans (canned),  cup / 1.3 g  Carrots (cooked),  cup / 2.3 g  Broccoli (cooked),  cup / 2.8 g  Peas (cooked),  cup / 4.4 g  Mashed potatoes,  cup / 1.6 g  Lettuce, 1 cup / 0.5 g  Corn (canned),  cup / 1.6 g  Tomato,  cup / 1.1 g Document Released: 06/25/2005 Document Revised: 12/25/2011 Document Reviewed: 09/27/2011 [employer] Patient Information 2015 [place], [place]. This information is not intended to replace advice given to you by your health care provider. Make sure you discuss any questions you have with your health  care provider.  

## 2015-03-09 ENCOUNTER — Other Ambulatory Visit: Payer: Self-pay | Admitting: Obstetrics and Gynecology

## 2015-03-11 LAB — CYTOLOGY - PAP

## 2016-08-06 DIAGNOSIS — R252 Cramp and spasm: Secondary | ICD-10-CM | POA: Diagnosis not present

## 2016-09-19 DIAGNOSIS — L820 Inflamed seborrheic keratosis: Secondary | ICD-10-CM | POA: Diagnosis not present

## 2016-09-19 DIAGNOSIS — R208 Other disturbances of skin sensation: Secondary | ICD-10-CM | POA: Diagnosis not present

## 2016-09-19 DIAGNOSIS — D225 Melanocytic nevi of trunk: Secondary | ICD-10-CM | POA: Diagnosis not present

## 2016-09-19 DIAGNOSIS — L821 Other seborrheic keratosis: Secondary | ICD-10-CM | POA: Diagnosis not present

## 2016-10-05 DIAGNOSIS — J029 Acute pharyngitis, unspecified: Secondary | ICD-10-CM | POA: Diagnosis not present

## 2016-10-05 DIAGNOSIS — J329 Chronic sinusitis, unspecified: Secondary | ICD-10-CM | POA: Diagnosis not present

## 2016-10-05 DIAGNOSIS — H6980 Other specified disorders of Eustachian tube, unspecified ear: Secondary | ICD-10-CM | POA: Diagnosis not present

## 2016-11-08 DIAGNOSIS — R52 Pain, unspecified: Secondary | ICD-10-CM | POA: Diagnosis not present

## 2016-11-08 DIAGNOSIS — M2550 Pain in unspecified joint: Secondary | ICD-10-CM | POA: Diagnosis not present

## 2016-11-08 DIAGNOSIS — E039 Hypothyroidism, unspecified: Secondary | ICD-10-CM | POA: Diagnosis not present

## 2016-11-08 DIAGNOSIS — R29898 Other symptoms and signs involving the musculoskeletal system: Secondary | ICD-10-CM | POA: Diagnosis not present

## 2016-11-19 ENCOUNTER — Encounter: Payer: Self-pay | Admitting: Neurology

## 2016-11-22 NOTE — Progress Notes (Signed)
Sylvia Hopkins was seen today in neurologic consultation at the request of [name], MD.  The consultation is for the evaluation of diffuse muscle cramping and weakness in the legs.  This patient is accompanied in the office by her spouse who supplements the history.  The records that were made available to me were reviewed.  Sx's x 6 months to 1 year.  Started when taking care of her dying dad.  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  States that she is cramping all over her body and thinks that all of the sitting at her job contributes.  She states that the exhaustion of tax season and working so much seems to have just made things worse.    She finds herself resting all weekend.  By the weekend, her lower back stays is cramping and has numbness.  She does have a hx of low back pain and "old injury" in the leg/knee with some paresthesias in the left leg.  She is now taking Mg++, at least 325 mg, and that is helping.  She drinks about 30 oz of water per day.  When asked to identify a single issue that is most bothersome to her she states "right now it is the low back pain."  It shoots down the L leg.  She hasn't done any PT.    C/o fatigue but not sleepy   Neuroimaging of brain has not previously been performed.    PREVIOUS MEDICATIONS: n/a  ALLERGIES:   Allergies  Allergen Reactions  . Advil [Ibuprofen] Other (See Comments)    Upset stomach     CURRENT MEDICATIONS:  Outpatient Encounter Prescriptions as of 11/26/2016  Medication Sig  . albuterol (PROVENTIL HFA;VENTOLIN HFA) 108 (90 BASE) MCG/ACT inhaler Inhale 2 puffs into the lungs every 6 (six) hours as needed for wheezing or shortness of breath.  . ALPRAZolam (XANAX) 0.5 MG tablet Take 0.5 mg by mouth as needed for anxiety.  Marland Kitchen amphetamine-dextroamphetamine (ADDERALL) 30 MG tablet  Take 30 mg by mouth 2 (two) times daily.  Marland Kitchen azelastine (ASTELIN) 0.1 % nasal spray Place 1 spray into both nostrils 2 (two) times daily. Use in each nostril as directed  . estradiol-norethindrone (COMBIPATCH) 0.05-0.14 MG/DAY Place 1 patch onto the skin 2 (two) times a week.  Marland Kitchen Fexofenadine HCl (ALLEGRA PO) Take 1 tablet by mouth every 12 (twelve) hours.  . hydrOXYzine (ATARAX/VISTARIL) 25 MG tablet Take 25 mg by mouth as needed.  Marland Kitchen levothyroxine (SYNTHROID, LEVOTHROID) 75 MCG tablet Take 75 mcg by mouth daily before breakfast.  . mometasone-formoterol (DULERA) 100-5 MCG/ACT AERO daily.  . montelukast (SINGULAIR) 10 MG tablet Take 10 mg by mouth at bedtime.  . sertraline (ZOLOFT) 50 MG tablet Take 50 mg by mouth daily.  . [DISCONTINUED] ALPRAZolam (XANAX) 0.25 MG tablet Take 0.25 mg by mouth at bedtime as needed for anxiety.  . [DISCONTINUED] amoxicillin-clavulanate (AUGMENTIN) 875-125 MG per tablet Take 1 tablet by mouth every 12 (twelve) hours. For 10 more days  . [DISCONTINUED] amphetamine-dextroamphetamine (ADDERALL) 30 MG tablet Take 30 mg by mouth See admin instructions. Takes Monday-Friday   No facility-administered encounter medications on file as of 11/26/2016.     PAST MEDICAL HISTORY:   Past Medical History:  Diagnosis Date  . ADHD   . Anxiety   . Diverticula of colon   .  Seasonal allergies   . Thyroid disease     PAST SURGICAL HISTORY:   Past Surgical History:  Procedure Laterality Date  . CESAREAN SECTION     x2  . TONSILECTOMY, ADENOIDECTOMY, BILATERAL MYRINGOTOMY AND TUBES      SOCIAL HISTORY:   Social History   Social History  . Marital status: Married    Spouse name: N/A  . Number of children: N/A  . Years of education: N/A   Occupational History  . Not on file.   Social History Main Topics  . Smoking status: Never Smoker  . Smokeless tobacco: Never Used  . Alcohol use Yes     Comment: a drink a day  . Drug use: No  . Sexual activity: Not on file    Other Topics Concern  . Not on file   Social History Narrative  . No narrative on file    FAMILY HISTORY:   Family Status  Relation Status  . Mother Deceased  . Father Deceased  . Sister Alive  . Brother Alive  . Child Alive    ROS:  Some CP that husband thinks may be due to panic attacks.  A complete 10 system review of systems was obtained and was unremarkable apart from what is mentioned above.  PHYSICAL EXAMINATION:    VITALS:   Vitals:   11/26/16 0916  BP: 112/70  Pulse: 94  SpO2: 94%  Weight: 199 lb (90.3 kg)  Height: 5\' 6"  (1.676 m)    GEN:  Normal appears female in no acute distress.  Appears stated age. HEENT:  Normocephalic, atraumatic. The mucous membranes are moist. The superficial temporal arteries are without ropiness or tenderness. Cardiovascular: Regular rate and rhythm. Lungs: Clear to auscultation bilaterally. Neck/Heme: There are no carotid bruits noted bilaterally.  NEUROLOGICAL: Orientation:  The patient is alert and oriented x 3.  Fund of knowledge is appropriate.  Recent and remote memory intact.  Attention span and concentration normal.  Repeats and names without difficulty. Cranial nerves: There is good facial symmetry. The pupils are equal round and reactive to light bilaterally. Fundoscopic exam reveals clear disc margins bilaterally. Extraocular muscles are intact and visual fields are full to confrontational testing. Speech is fluent and clear. Soft palate rises symmetrically and there is no tongue deviation. Hearing is intact to conversational tone. Tone: Tone is good throughout. Sensation: Sensation is intact to light touch and pinprick throughout (facial, trunk, extremities).   No sensory dermatomal level and no change in pinprick in stocking distribution.  Vibration is intact at the bilateral big toe but slightly decreased compared to the hands. There is no extinction with double simultaneous stimulation. There is no sensory dermatomal  level identified. Coordination:  The patient has no difficulty with RAM's or FNF bilaterally. Motor: Strength is 5/5 in the bilateral upper and lower extremities (requires encouragement in the LLE but able to obtain 5/5).  Shoulder shrug is equal and symmetric. There is no pronator drift.  There are no fasciculations noted. DTR's: Deep tendon reflexes are 2/4 at the bilateral biceps, triceps, brachioradialis, patella and achilles.  Plantar responses are downgoing bilaterally. Gait and Station: The patient is slow and purposeful to ambulate but steady.  She is able to ambulate in a tandem fashion and stand in romberg position.  Labs:  Labs received and reviewed from her primary care physician and were dated 11/08/2016.  Sodium was 138, potassium 4.7, chloride 103, CO2 30, BUN 14, creatinine 0.65, glucose 101.  AST 14, ALT  14, alkaline phosphatase 37.  White blood cells were 5.2, hemoglobin 15.2, hematocrit 44.0 and platelets 276.  CPK was normal at 54.  TSH normal at 1.77.  IMPRESSION/PLAN  1. Low back pain with diffuse cramping of muscles  -we will schedule an MRI lumbar spine.  If negative, she is agreeable to PT  -discussed EMG and pt decided to hold on that but may change mind.  -will check iron, b12, vit D (hx of severe deficiency per pt), ferritin  -husband thinks that some issues may be due to "burnout" at workout and discussed physical impact of stress at work.  Husband asks if any brain scan will show this and told him no.  Pt thinks that it is getting better.  -discussed increasing water intake to 60 oz per day and decreasing EtOH intake (currently 2-3 drinks per day)  2.  Will determine f/u based on above testing results.  Much greater than 50% of this visit was spent in counseling and coordinating care.  Total face to face time:  60 min    Cc:  [name], MD

## 2016-11-26 ENCOUNTER — Ambulatory Visit (INDEPENDENT_AMBULATORY_CARE_PROVIDER_SITE_OTHER): Payer: 59 | Admitting: Neurology

## 2016-11-26 ENCOUNTER — Encounter: Payer: Self-pay | Admitting: Neurology

## 2016-11-26 ENCOUNTER — Other Ambulatory Visit: Payer: 59

## 2016-11-26 VITALS — BP 112/70 | HR 94 | Ht 66.0 in | Wt 199.0 lb

## 2016-11-26 DIAGNOSIS — R202 Paresthesia of skin: Secondary | ICD-10-CM

## 2016-11-26 DIAGNOSIS — M5442 Lumbago with sciatica, left side: Secondary | ICD-10-CM | POA: Diagnosis not present

## 2016-11-26 DIAGNOSIS — R252 Cramp and spasm: Secondary | ICD-10-CM

## 2016-11-26 DIAGNOSIS — E559 Vitamin D deficiency, unspecified: Secondary | ICD-10-CM

## 2016-11-26 DIAGNOSIS — M5416 Radiculopathy, lumbar region: Secondary | ICD-10-CM | POA: Diagnosis not present

## 2016-11-26 NOTE — Patient Instructions (Signed)
1. Your provider has requested that you have labwork completed today. Please go to [facility] ([id number]) on the second floor of this building before leaving the office today. You do not need to check in. If you are not called within 15 minutes please check with the front desk.   2. We have sent a referral to [facility] for your MRI and they will call you directly to schedule your appt. They are located at [address] If you need to contact them directly please call [phone].    

## 2016-11-27 DIAGNOSIS — M545 Low back pain: Secondary | ICD-10-CM | POA: Diagnosis not present

## 2016-11-27 DIAGNOSIS — M1712 Unilateral primary osteoarthritis, left knee: Secondary | ICD-10-CM | POA: Diagnosis not present

## 2016-11-27 DIAGNOSIS — M25562 Pain in left knee: Secondary | ICD-10-CM | POA: Diagnosis not present

## 2016-11-27 DIAGNOSIS — M5442 Lumbago with sciatica, left side: Secondary | ICD-10-CM | POA: Diagnosis not present

## 2016-11-27 LAB — VITAMIN B12: Vitamin B-12: 918 pg/mL (ref 200–1100)

## 2016-11-27 LAB — IRON: Iron: 91 ug/dL (ref 45–160)

## 2016-11-27 LAB — FERRITIN: Ferritin: 39 ng/mL (ref 20–288)

## 2016-11-28 ENCOUNTER — Telehealth: Payer: Self-pay | Admitting: Neurology

## 2016-11-28 LAB — VITAMIN D 1,25 DIHYDROXY
VITAMIN D 1, 25 (OH) TOTAL: 11 pg/mL — AB (ref 18–72)
VITAMIN D3 1, 25 (OH): 11 pg/mL
Vitamin D2 1, 25 (OH)2: <8 pg/mL

## 2016-11-28 NOTE — Telephone Encounter (Signed)
Mychart message sent to patient.

## 2016-11-28 NOTE — Telephone Encounter (Signed)
-----   Message from [place], DO sent at 11/28/2016  2:52 PM EDT ----- Let pt know that only thing that was abnormal on labs was vit D and I checked that only because they asked me to while drawing labs b/c she had deficiency in past.  Send labs to PCP.  Likely needs vit D supplement.  Needs f/u with PCP about that

## 2016-11-30 ENCOUNTER — Telehealth: Payer: Self-pay | Admitting: Neurology

## 2016-11-30 NOTE — Telephone Encounter (Signed)
Arlene called from [facility] regarding an MRI that needs to be cancelled. She said the patient wants Dr. Rip Harbour to do it now. Also, that the insurance company would need to be notified. Thanks

## 2016-12-04 NOTE — Telephone Encounter (Signed)
Imaging was never scheduled. [employer] and cancelled the authorization. Called Arlene and left message on machine to let her know.

## 2016-12-07 DIAGNOSIS — M545 Low back pain: Secondary | ICD-10-CM | POA: Diagnosis not present

## 2016-12-11 DIAGNOSIS — M1712 Unilateral primary osteoarthritis, left knee: Secondary | ICD-10-CM | POA: Diagnosis not present

## 2016-12-13 DIAGNOSIS — M545 Low back pain: Secondary | ICD-10-CM | POA: Diagnosis not present

## 2016-12-19 DIAGNOSIS — M545 Low back pain: Secondary | ICD-10-CM | POA: Diagnosis not present

## 2016-12-21 DIAGNOSIS — M545 Low back pain: Secondary | ICD-10-CM | POA: Diagnosis not present

## 2016-12-25 DIAGNOSIS — M545 Low back pain: Secondary | ICD-10-CM | POA: Diagnosis not present

## 2016-12-28 DIAGNOSIS — M545 Low back pain: Secondary | ICD-10-CM | POA: Diagnosis not present

## 2016-12-31 DIAGNOSIS — M545 Low back pain: Secondary | ICD-10-CM | POA: Diagnosis not present

## 2017-01-02 DIAGNOSIS — M545 Low back pain: Secondary | ICD-10-CM | POA: Diagnosis not present

## 2017-01-03 DIAGNOSIS — M545 Low back pain: Secondary | ICD-10-CM | POA: Diagnosis not present

## 2017-01-03 DIAGNOSIS — R7989 Other specified abnormal findings of blood chemistry: Secondary | ICD-10-CM | POA: Diagnosis not present

## 2017-01-07 DIAGNOSIS — M545 Low back pain: Secondary | ICD-10-CM | POA: Diagnosis not present

## 2017-01-10 DIAGNOSIS — M545 Low back pain: Secondary | ICD-10-CM | POA: Diagnosis not present

## 2017-01-22 IMAGING — CR DG CHEST 2V
2 series · 2 of 2 positions shown · non-contrast
Comparison: Left clavicle 10/01/2007

CLINICAL DATA: Weakness with abdominal pain and nausea since [DATE]
today.

EXAM:
CHEST  2 VIEW

[chest pa]
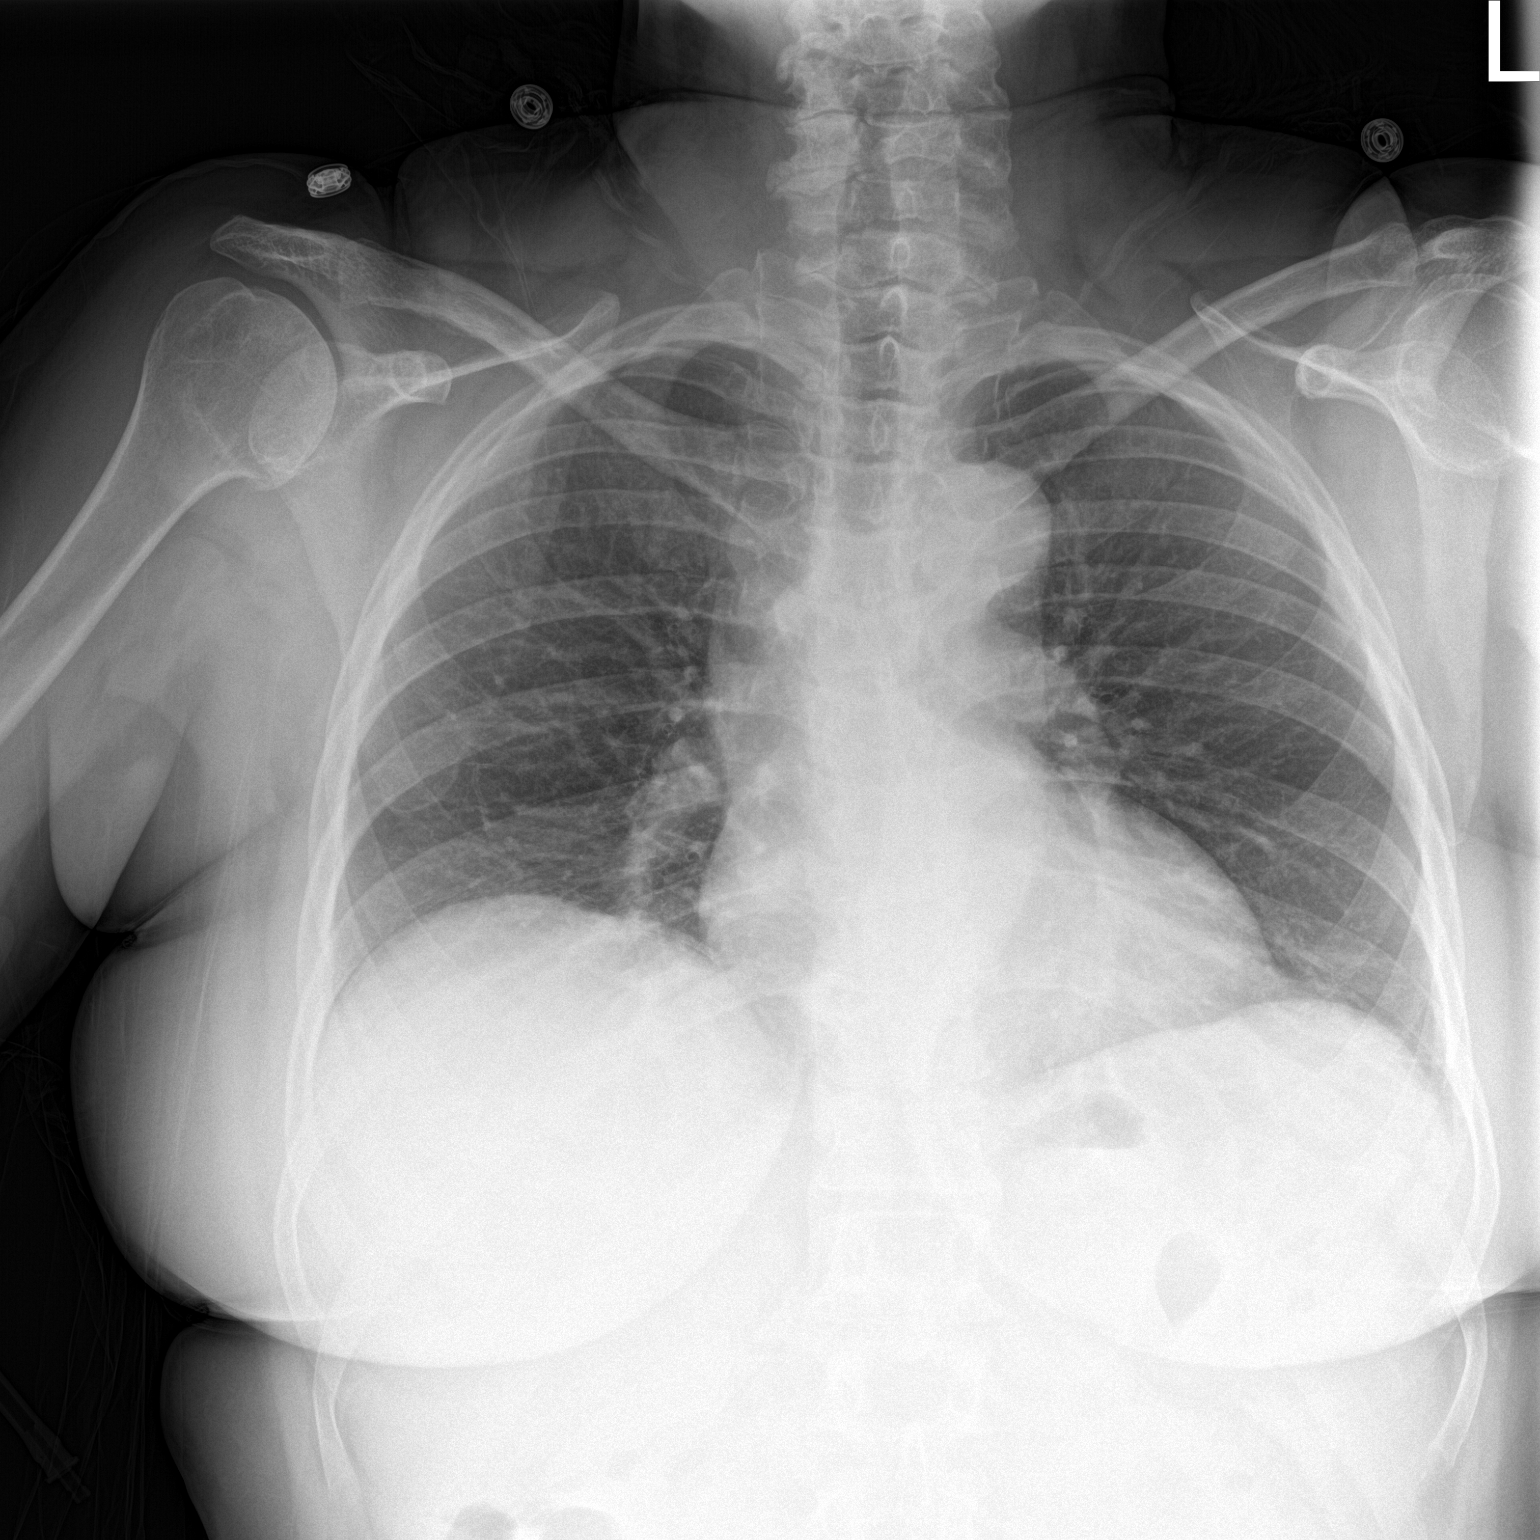

[chest lat]
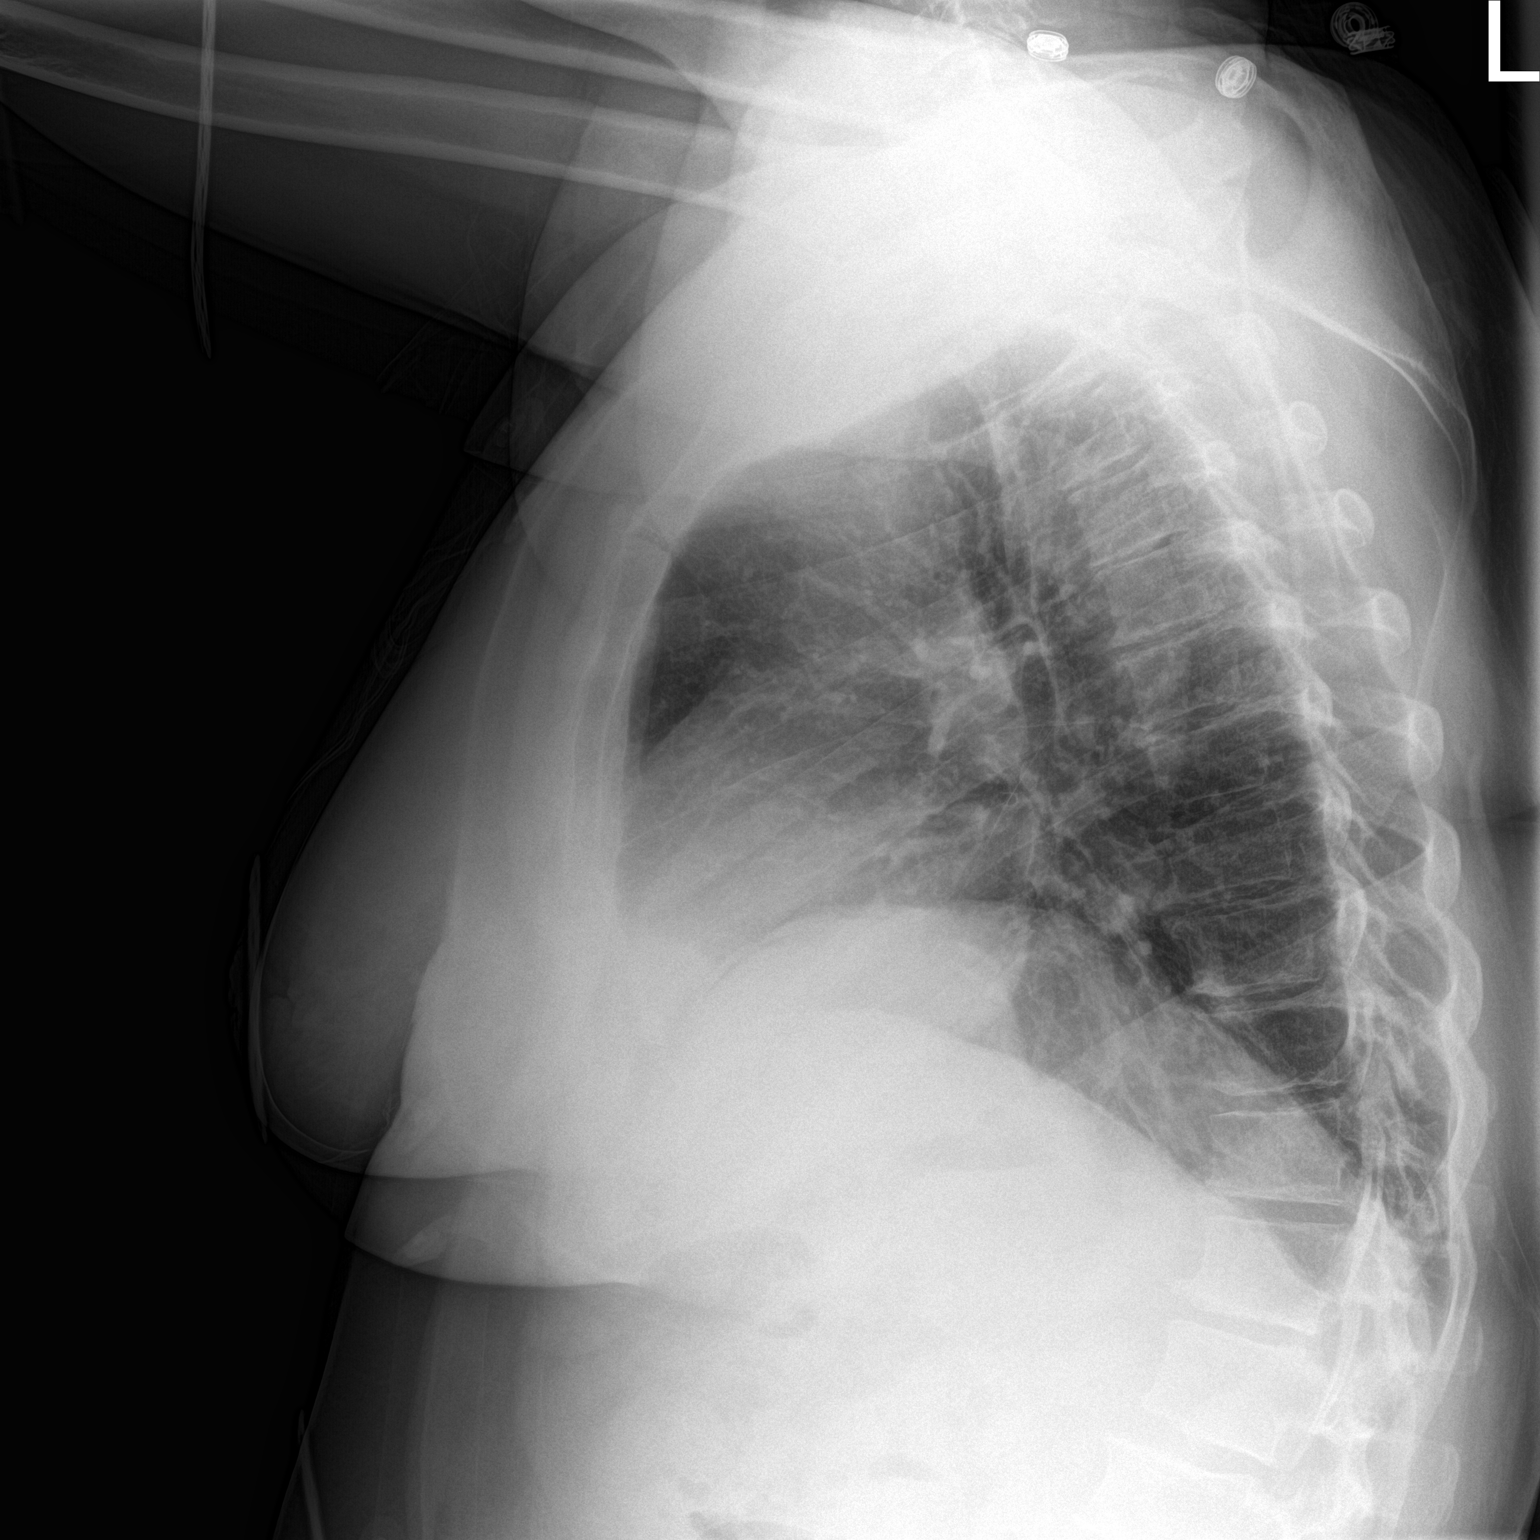

[2 of 2 positions shown; findings below may reference images not displayed]

FINDINGS: Slightly shallow inspiration. Normal heart size and pulmonary
vascularity. No focal airspace disease or consolidation in the
lungs. No blunting of costophrenic angles. No pneumothorax.
Mediastinal contours appear intact. Tortuous aorta. Mild
degenerative changes in the spine.
IMPRESSION: No active cardiopulmonary disease.

## 2017-01-22 IMAGING — CT CT ABD-PELV W/ CM
2 of 5 series · 16 of 46 positions shown, 18 images · IV contrast (Omni 300)
Comparison: None.

CLINICAL DATA: 58-year-old female with mid abdominal pain

EXAM:
CT ABDOMEN AND PELVIS WITH CONTRAST
TECHNIQUE: Multidetector CT imaging of the abdomen and pelvis was performed
using the standard protocol following bolus administration of
intravenous contrast.
CONTRAST:  100mL OMNIPAQUE IOHEXOL 300 MG/ML  SOLN

[Series 2: abd/ pelvis 5.0 i30f 1 · axial · 0.78mm/px · z∈[+853,+1308]mm · 13 of 103 slices shown, 15 images]
[im 6/103  soft-tissue]
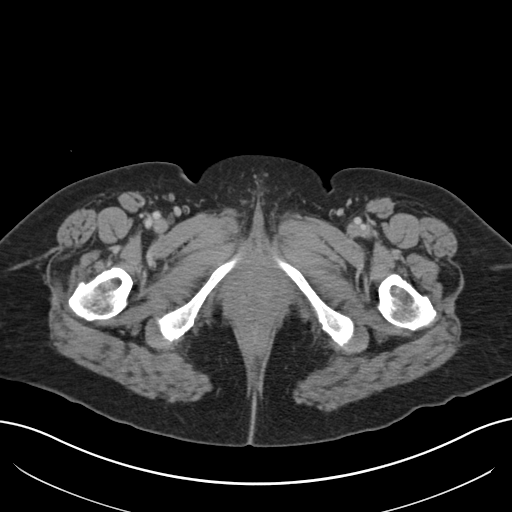
[im 6/103  bone]
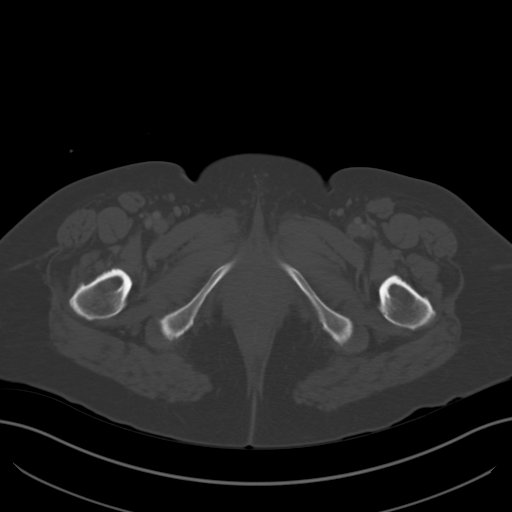
[im 12/103  soft-tissue]
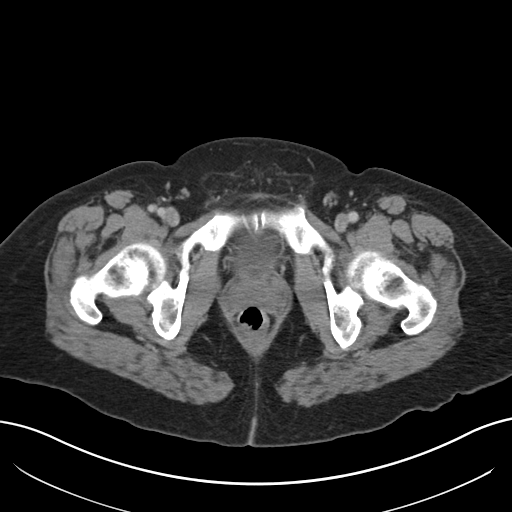
[im 23/103  soft-tissue]
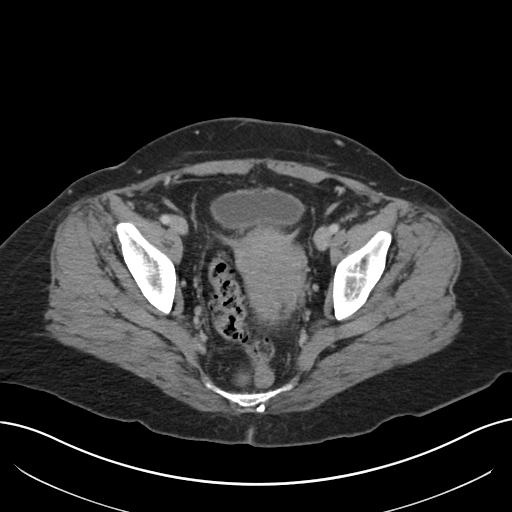
[im 29/103  soft-tissue]
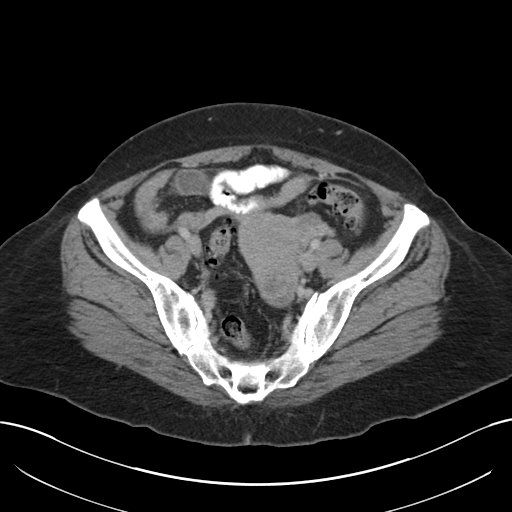
[im 35/103  soft-tissue]
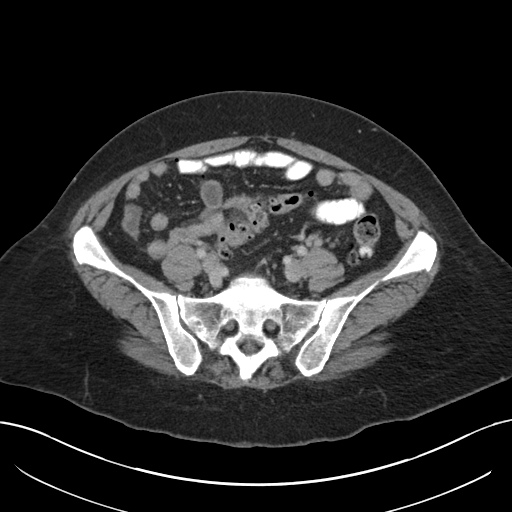
[im 46/103  soft-tissue]
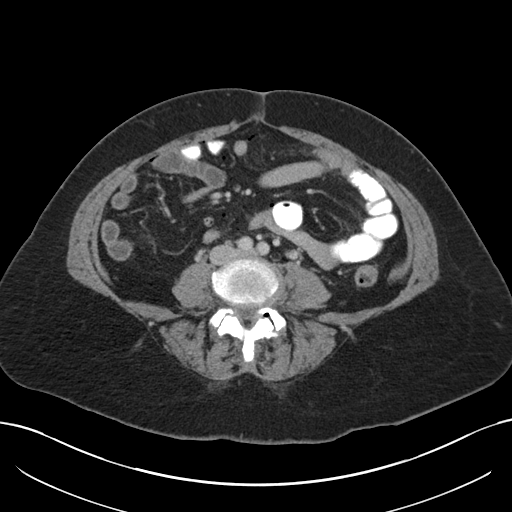
[im 52/103  soft-tissue]
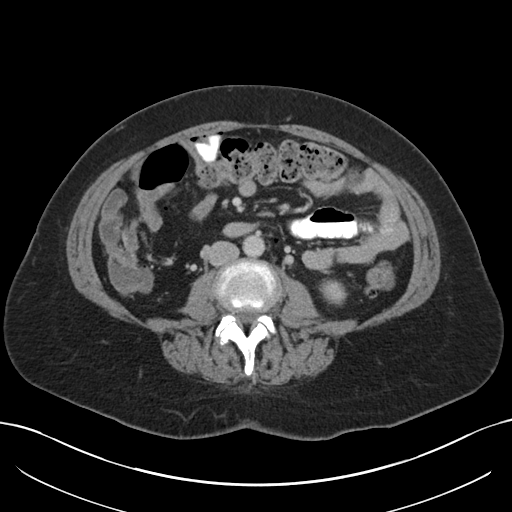
[im 57/103  soft-tissue]
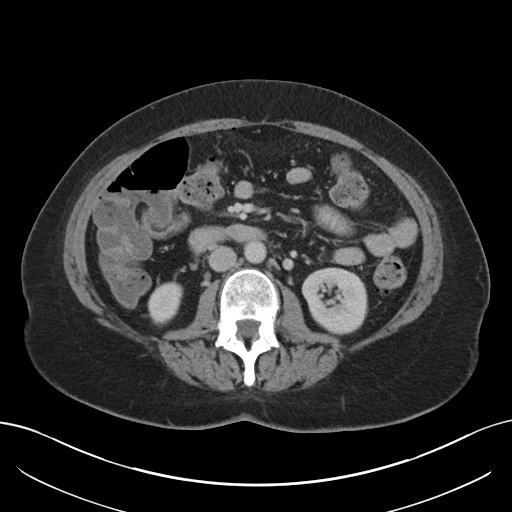
[im 69/103  soft-tissue]
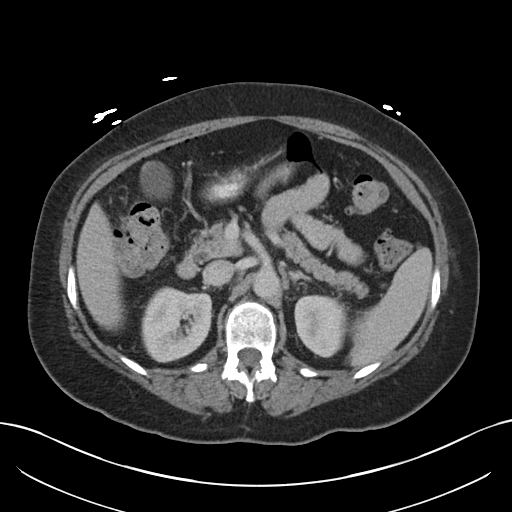
[im 69/103  bone]
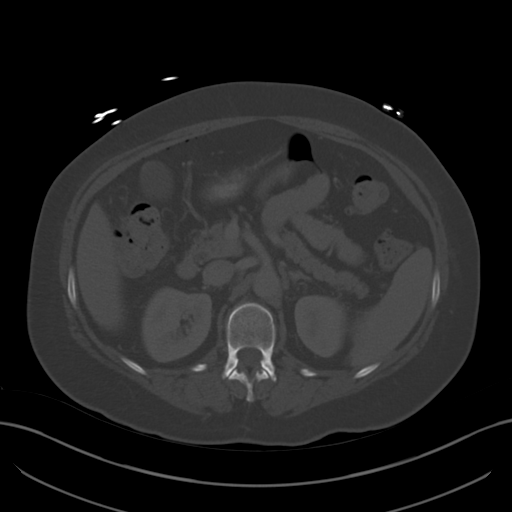
[im 74/103  soft-tissue]
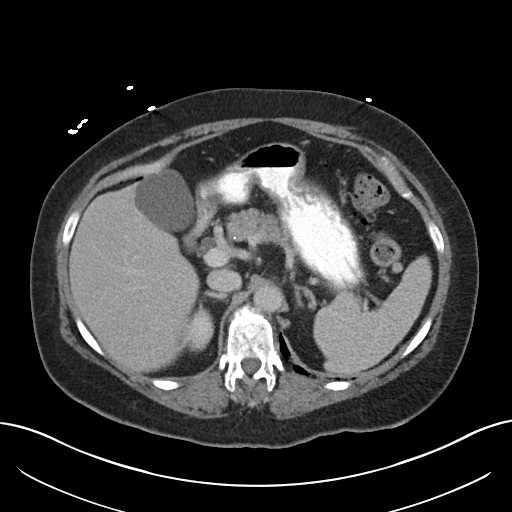
[im 80/103  soft-tissue]
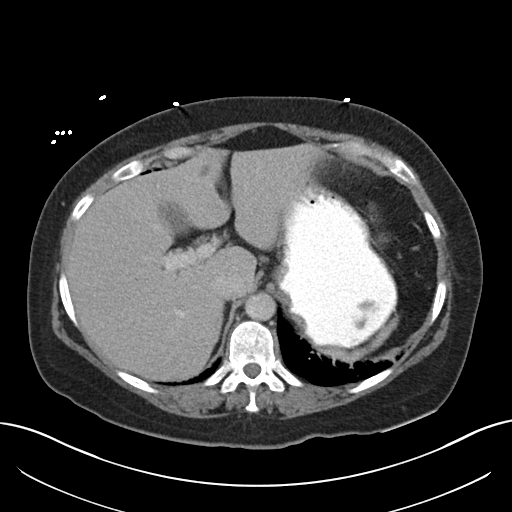
[im 91/103  soft-tissue]
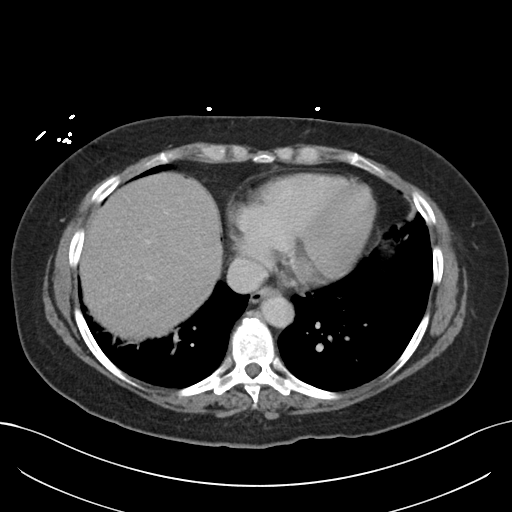
[im 97/103  soft-tissue]
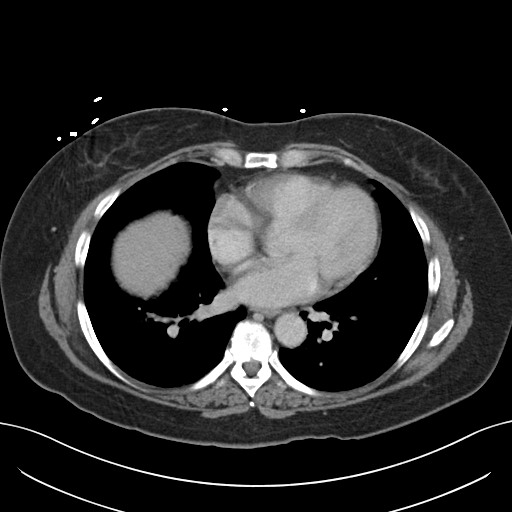

[Series 5: coronals · coronal · 0.68mm/px · 3 of 121 slices shown]
[im 41/121  soft-tissue]
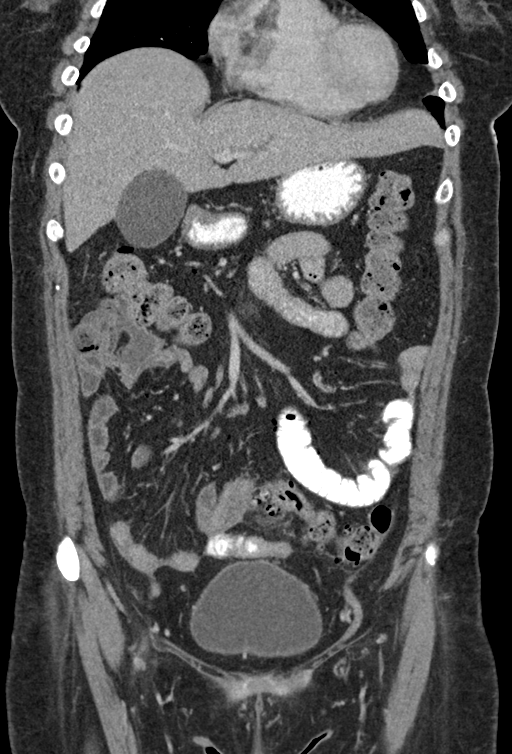
[im 54/121  soft-tissue]
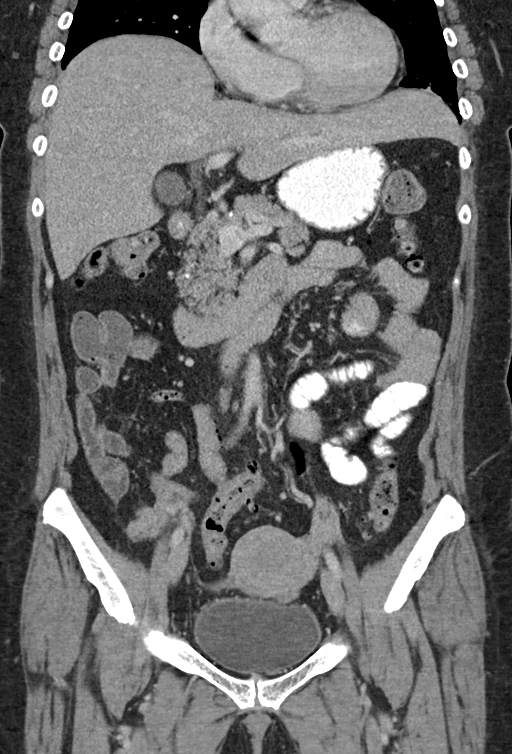
[im 67/121  soft-tissue]
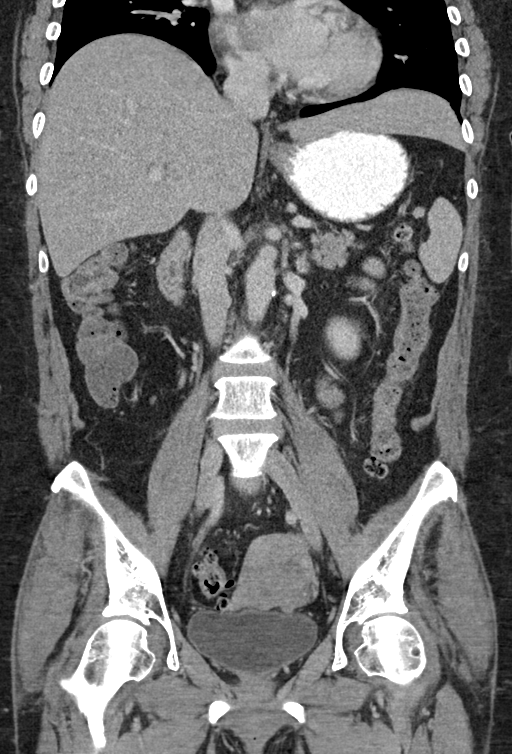

[16 of 46 positions shown; findings below may reference images not displayed]

FINDINGS: The the visualized lung bases are clear.

Small pneumoperitoneum.  No free fluid.

Small scattered hepatic hypodense lesions are not well characterized
but likely represent cysts. The gallbladder, pancreas, spleen,
adrenal glands, kidneys, visualized ureters, and urinary bladder
appear unremarkable. There is a 3.4 x 3.1 cm posterior uterine
fibroid. Ultrasound may provide better evaluation of the pelvic
structures.

There is extensive sigmoid diverticulosis. There is focal area of
inflammatory changes involving the sigmoid colon compatible with
diverticulitis. No drainable fluid collection/ abscess identified.
Small pockets of air noted throughout the mesentery and peritoneum
compatible with micro perforations.

The visualized abdominal aorta and IVC appear unremarkable. No
portal venous gas identified. There is no lymphadenopathy. Small fat
containing umbilical hernia. The degenerative changes of the spine.
No acute fracture.
IMPRESSION: Perforated sigmoid diverticulitis.  No abscess.

Critical Value/emergent results were called by telephone at the time
of interpretation on 01/17/2015 at [DATE] to Dr. WELDES MESQUITTA , who
verbally acknowledged these results.

## 2017-02-04 DIAGNOSIS — M545 Low back pain: Secondary | ICD-10-CM | POA: Diagnosis not present

## 2017-02-06 DIAGNOSIS — M545 Low back pain: Secondary | ICD-10-CM | POA: Diagnosis not present

## 2017-02-12 DIAGNOSIS — Z01419 Encounter for gynecological examination (general) (routine) without abnormal findings: Secondary | ICD-10-CM | POA: Diagnosis not present

## 2017-02-13 DIAGNOSIS — M545 Low back pain: Secondary | ICD-10-CM | POA: Diagnosis not present

## 2017-02-14 DIAGNOSIS — R946 Abnormal results of thyroid function studies: Secondary | ICD-10-CM | POA: Diagnosis not present

## 2017-02-19 DIAGNOSIS — M545 Low back pain: Secondary | ICD-10-CM | POA: Diagnosis not present

## 2017-02-21 DIAGNOSIS — M545 Low back pain: Secondary | ICD-10-CM | POA: Diagnosis not present

## 2017-02-26 DIAGNOSIS — M545 Low back pain: Secondary | ICD-10-CM | POA: Diagnosis not present

## 2017-02-28 DIAGNOSIS — M545 Low back pain: Secondary | ICD-10-CM | POA: Diagnosis not present

## 2017-03-07 DIAGNOSIS — M545 Low back pain: Secondary | ICD-10-CM | POA: Diagnosis not present

## 2017-03-18 NOTE — Progress Notes (Signed)
Sylvia Hopkins was seen today in neurologic consultation at the request of [name], MD.  The consultation is for the evaluation of diffuse muscle cramping and weakness in the legs.  This patient is accompanied in the office by her spouse who supplements the history.  The records that were made available to me were reviewed.  Sx's x 6 months to 1 year.  Started when taking care of her dying dad.  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  States that she is cramping all over her body and thinks that all of the sitting at her job contributes.  She states that the exhaustion of tax season and working so much seems to have just made things worse.    She finds herself resting all weekend.  By the weekend, her lower back stays is cramping and has numbness.  She does have a hx of low back pain and "old injury" in the leg/knee with some paresthesias in the left leg.  She is now taking Mg++, at least 325 mg, and that is helping.  She drinks about 30 oz of water per day.  When asked to identify a single issue that is most bothersome to her she states "right now it is the low back pain."  It shoots down the L leg.  She hasn't done any PT.    03/20/17 update:  Patient seen today in follow-up, accompanied by her husband who supplements the history.  Last visit, I ordered an MRI of the lumbar spine for her low back pain and cramping of the muscles.  She ended up having her orthopedist office call here and asked [place] to cancel that because she wanted it done through their office.  I did review this through PACS which was done on 12/07/16.  It showed advanced facet arthropathy at the L4-L5 level with 5 mm of anterolisthesis at that level.  There was disc bulging and bilateral neural foraminal stenosis, left greater than right.  I do not have any notes from her orthopedic  surgeon.   She is doing PT and feels that she is getting better but "I don't feel like I'm progressing like I should."   She comes because she was hoping to get injections in the back here.  Her husband has given her a knee brace and that has helped knee pain when she uses that  Lab work was done.  The only lab that was abnormal that we did was vitamin D, which she asked [place] to perform.  We asked her to follow-up with her primary care physician in that regard.   Neuroimaging of brain has not previously been performed.    PREVIOUS MEDICATIONS: n/a  ALLERGIES:   Allergies  Allergen Reactions  . Advil [Ibuprofen] Other (See Comments)    Upset stomach     CURRENT MEDICATIONS:  Outpatient Encounter Prescriptions as of 03/20/2017  Medication Sig  . albuterol (PROVENTIL HFA;VENTOLIN HFA) 108 (90 BASE) MCG/ACT inhaler Inhale 2 puffs into the lungs every 6 (six) hours as needed for wheezing or shortness of breath.  . ALPRAZolam (XANAX) 0.5 MG tablet Take 0.5 mg by mouth as needed for anxiety.  Marland Kitchen amphetamine-dextroamphetamine (ADDERALL) 30 MG tablet Take 30 mg by mouth daily.   Marland Kitchen azelastine (ASTELIN) 0.1 % nasal spray Place 1 spray into both nostrils 2 (  two) times daily. Use in each nostril as directed  . estradiol-norethindrone (COMBIPATCH) 0.05-0.14 MG/DAY Place 1 patch onto the skin 2 (two) times a week.  Marland Kitchen Fexofenadine HCl (ALLEGRA PO) Take 1 tablet by mouth every 12 (twelve) hours.  . hydrOXYzine (ATARAX/VISTARIL) 25 MG tablet Take 25 mg by mouth as needed.  . mometasone-formoterol (DULERA) 100-5 MCG/ACT AERO daily.  . montelukast (SINGULAIR) 10 MG tablet Take 10 mg by mouth at bedtime.  . sertraline (ZOLOFT) 50 MG tablet Take 25 mg by mouth daily.   . [DISCONTINUED] levothyroxine (SYNTHROID, LEVOTHROID) 75 MCG tablet Take 75 mcg by mouth daily before breakfast.  . SYNTHROID 25 MCG tablet Take 1 tablet by mouth daily.   No facility-administered encounter medications on file as of 03/20/2017.       PAST MEDICAL HISTORY:   Past Medical History:  Diagnosis Date  . ADHD   . Anxiety   . Diverticula of colon   . Seasonal allergies   . Thyroid disease     PAST SURGICAL HISTORY:   Past Surgical History:  Procedure Laterality Date  . CESAREAN SECTION     x2  . TONSILLECTOMY      SOCIAL HISTORY:   Social History   Social History  . Marital status: Married    Spouse name: N/A  . Number of children: N/A  . Years of education: N/A   Occupational History  . accounting work    Social History Main Topics  . Smoking status: Never Smoker  . Smokeless tobacco: Never Used  . Alcohol use Yes     Comment: 2-3 drinks per day  . Drug use: No  . Sexual activity: Not on file   Other Topics Concern  . Not on file   Social History Narrative  . No narrative on file    FAMILY HISTORY:   Family Status  Relation Status  . Mother Deceased  . Father Deceased  . Sister Alive  . Brother Alive  . Child Alive  . Child Deceased    ROS:   A complete 10 system review of systems was obtained and was unremarkable apart from what is mentioned above.  PHYSICAL EXAMINATION:    VITALS:   Vitals:   03/20/17 0918  BP: 112/74  Pulse: 94  SpO2: 97%  Weight: 201 lb 11.2 oz (91.5 kg)  Height: 5\' 6"  (1.676 m)    GEN:  Normal appears female in no acute distress.  Appears stated age. HEENT:  Normocephalic, atraumatic. The mucous membranes are moist. The superficial temporal arteries are without ropiness or tenderness. Cardiovascular: Regular rate and rhythm. Lungs: Clear to auscultation bilaterally. Neck/Heme: There are no carotid bruits noted bilaterally.  NEUROLOGICAL: Orientation:  The patient is alert and oriented x 3.   Cranial nerves: There is good facial symmetry. Speech is fluent and clear. Soft palate rises symmetrically and there is no tongue deviation. Hearing is intact to conversational tone. Tone: Tone is good throughout. Sensation: Sensation is intact to light touch  throughout.  Vibration intact but slightly decreased at bilateral big toe Coordination:  The patient has no difficulty with RAM's or FNF bilaterally. Motor: Strength is 5/5 in the bilateral upper and lower extremities (requires encouragement in the LLE but able to obtain 5/5).  Shoulder shrug is equal and symmetric. There is no pronator drift.  There are no fasciculations noted. Gait and Station: The patient ambulates well today  Labs:  Labs received and reviewed from her primary care physician and were dated  11/08/2016.  Sodium was 138, potassium 4.7, chloride 103, CO2 30, BUN 14, creatinine 0.65, glucose 101.  AST 14, ALT 14, alkaline phosphatase 37.  White blood cells were 5.2, hemoglobin 15.2, hematocrit 44.0 and platelets 276.  CPK was normal at 54.  TSH normal at 1.77.  IMPRESSION/PLAN  1. Low back pain with diffuse cramping of muscles  - We had ordered MRI of the lumbar spine, the patient requested that this be done by her orthopedic surgeon.  It was and showed advanced facet arthropathy at the L4-L5 level with 5 mm of anterolisthesis at that level.  There was disc bulging and bilateral neural foraminal stenosis, left greater than right.  I do not have any notes from her orthopedic surgeon.  She wanted me to do epidural injections.  Explained that we don't do these here.  Her ortho likely does but she wanted done by neurology/neursx.  Told her I could refer.  She will let me know if wants referral  -[facility]  -told patient that dry needling may be of value  -  2.  F/u prn    Cc:  [name], MD

## 2017-03-20 ENCOUNTER — Ambulatory Visit (INDEPENDENT_AMBULATORY_CARE_PROVIDER_SITE_OTHER): Payer: 59 | Admitting: Neurology

## 2017-03-20 ENCOUNTER — Encounter: Payer: Self-pay | Admitting: Neurology

## 2017-03-20 VITALS — BP 112/74 | HR 94 | Ht 66.0 in | Wt 201.7 lb

## 2017-03-20 DIAGNOSIS — M5416 Radiculopathy, lumbar region: Secondary | ICD-10-CM

## 2017-03-20 DIAGNOSIS — M5441 Lumbago with sciatica, right side: Secondary | ICD-10-CM

## 2017-03-20 DIAGNOSIS — M5442 Lumbago with sciatica, left side: Secondary | ICD-10-CM

## 2017-03-20 NOTE — Patient Instructions (Signed)
We will make a referral to [facility]  You may want to ask your physical therapist about dry needling  Let us know if you want a referral for injections

## 2017-03-21 DIAGNOSIS — M545 Low back pain: Secondary | ICD-10-CM | POA: Diagnosis not present

## 2017-03-22 DIAGNOSIS — K922 Gastrointestinal hemorrhage, unspecified: Secondary | ICD-10-CM | POA: Diagnosis not present

## 2017-03-22 DIAGNOSIS — T39395A Adverse effect of other nonsteroidal anti-inflammatory drugs [NSAID], initial encounter: Secondary | ICD-10-CM | POA: Diagnosis not present

## 2017-03-25 ENCOUNTER — Telehealth: Payer: Self-pay | Admitting: Neurology

## 2017-03-25 NOTE — Telephone Encounter (Signed)
[facility] faxed referral to [facility] at [phone] for aquatic therapy with confirmation received 03/20/17.

## 2017-04-05 DIAGNOSIS — R946 Abnormal results of thyroid function studies: Secondary | ICD-10-CM | POA: Diagnosis not present

## 2017-04-11 DIAGNOSIS — Z23 Encounter for immunization: Secondary | ICD-10-CM | POA: Diagnosis not present

## 2017-04-11 DIAGNOSIS — J4520 Mild intermittent asthma, uncomplicated: Secondary | ICD-10-CM | POA: Diagnosis not present

## 2017-05-03 DIAGNOSIS — K921 Melena: Secondary | ICD-10-CM | POA: Diagnosis not present

## 2017-05-03 DIAGNOSIS — Z1211 Encounter for screening for malignant neoplasm of colon: Secondary | ICD-10-CM | POA: Diagnosis not present

## 2017-06-05 DIAGNOSIS — R7989 Other specified abnormal findings of blood chemistry: Secondary | ICD-10-CM | POA: Diagnosis not present

## 2017-06-28 DIAGNOSIS — K210 Gastro-esophageal reflux disease with esophagitis: Secondary | ICD-10-CM | POA: Diagnosis not present

## 2017-06-28 DIAGNOSIS — K317 Polyp of stomach and duodenum: Secondary | ICD-10-CM | POA: Diagnosis not present

## 2017-06-28 DIAGNOSIS — Z1211 Encounter for screening for malignant neoplasm of colon: Secondary | ICD-10-CM | POA: Diagnosis not present

## 2017-06-28 DIAGNOSIS — K2930 Chronic superficial gastritis without bleeding: Secondary | ICD-10-CM | POA: Diagnosis not present

## 2017-06-28 DIAGNOSIS — D126 Benign neoplasm of colon, unspecified: Secondary | ICD-10-CM | POA: Diagnosis not present

## 2017-08-04 DIAGNOSIS — J069 Acute upper respiratory infection, unspecified: Secondary | ICD-10-CM | POA: Diagnosis not present

## 2017-08-04 DIAGNOSIS — R05 Cough: Secondary | ICD-10-CM | POA: Diagnosis not present

## 2017-08-12 DIAGNOSIS — T161XXA Foreign body in right ear, initial encounter: Secondary | ICD-10-CM | POA: Diagnosis not present

## 2017-10-10 DIAGNOSIS — J4520 Mild intermittent asthma, uncomplicated: Secondary | ICD-10-CM | POA: Diagnosis not present

## 2017-10-10 DIAGNOSIS — Z23 Encounter for immunization: Secondary | ICD-10-CM | POA: Diagnosis not present

## 2018-03-25 ENCOUNTER — Encounter (INDEPENDENT_AMBULATORY_CARE_PROVIDER_SITE_OTHER): Payer: 59

## 2018-03-26 DIAGNOSIS — R1032 Left lower quadrant pain: Secondary | ICD-10-CM | POA: Diagnosis not present

## 2018-03-26 DIAGNOSIS — K5792 Diverticulitis of intestine, part unspecified, without perforation or abscess without bleeding: Secondary | ICD-10-CM | POA: Diagnosis not present

## 2018-03-26 DIAGNOSIS — K5900 Constipation, unspecified: Secondary | ICD-10-CM | POA: Diagnosis not present

## 2018-03-26 DIAGNOSIS — K219 Gastro-esophageal reflux disease without esophagitis: Secondary | ICD-10-CM | POA: Diagnosis not present

## 2018-04-02 ENCOUNTER — Encounter (INDEPENDENT_AMBULATORY_CARE_PROVIDER_SITE_OTHER): Payer: Self-pay | Admitting: Bariatrics

## 2018-04-03 ENCOUNTER — Ambulatory Visit (INDEPENDENT_AMBULATORY_CARE_PROVIDER_SITE_OTHER): Payer: 59 | Admitting: Bariatrics

## 2018-04-03 ENCOUNTER — Encounter (INDEPENDENT_AMBULATORY_CARE_PROVIDER_SITE_OTHER): Payer: Self-pay | Admitting: Bariatrics

## 2018-04-03 VITALS — BP 144/95 | HR 95 | Temp 97.7°F | Ht 64.0 in | Wt 201.0 lb

## 2018-04-03 DIAGNOSIS — E559 Vitamin D deficiency, unspecified: Secondary | ICD-10-CM | POA: Insufficient documentation

## 2018-04-03 DIAGNOSIS — R5383 Other fatigue: Secondary | ICD-10-CM | POA: Diagnosis not present

## 2018-04-03 DIAGNOSIS — K219 Gastro-esophageal reflux disease without esophagitis: Secondary | ICD-10-CM | POA: Insufficient documentation

## 2018-04-03 DIAGNOSIS — Z0289 Encounter for other administrative examinations: Secondary | ICD-10-CM

## 2018-04-03 DIAGNOSIS — F3289 Other specified depressive episodes: Secondary | ICD-10-CM | POA: Diagnosis not present

## 2018-04-03 DIAGNOSIS — R030 Elevated blood-pressure reading, without diagnosis of hypertension: Secondary | ICD-10-CM

## 2018-04-03 DIAGNOSIS — R0602 Shortness of breath: Secondary | ICD-10-CM | POA: Diagnosis not present

## 2018-04-03 DIAGNOSIS — Z6834 Body mass index (BMI) 34.0-34.9, adult: Secondary | ICD-10-CM

## 2018-04-03 DIAGNOSIS — Z9189 Other specified personal risk factors, not elsewhere classified: Secondary | ICD-10-CM

## 2018-04-03 DIAGNOSIS — Z8639 Personal history of other endocrine, nutritional and metabolic disease: Secondary | ICD-10-CM

## 2018-04-03 DIAGNOSIS — E669 Obesity, unspecified: Secondary | ICD-10-CM

## 2018-04-04 LAB — HEMOGLOBIN A1C
ESTIMATED AVERAGE GLUCOSE: 111 mg/dL
Hgb A1c MFr Bld: 5.5 % (ref 4.8–5.6)

## 2018-04-04 LAB — VITAMIN D 25 HYDROXY (VIT D DEFICIENCY, FRACTURES): Vit D, 25-Hydroxy: 26.9 ng/mL — ABNORMAL LOW (ref 30.0–100.0)

## 2018-04-04 LAB — TSH: TSH: 3.38 u[IU]/mL (ref 0.450–4.500)

## 2018-04-04 LAB — VITAMIN B12: VITAMIN B 12: 990 pg/mL (ref 232–1245)

## 2018-04-04 LAB — T3: T3 TOTAL: 126 ng/dL (ref 71–180)

## 2018-04-04 LAB — FOLATE: FOLATE: 14 ng/mL (ref >3.0)

## 2018-04-04 LAB — T4, FREE: Free T4: 1.36 ng/dL (ref 0.82–1.77)

## 2018-04-04 LAB — INSULIN, RANDOM: INSULIN: 8.2 u[IU]/mL (ref 2.6–24.9)

## 2018-04-07 NOTE — Progress Notes (Signed)
Office: [phone]  /  Fax: [phone]   Dear Dr. Tamala Julian,   Thank you for referring Sylvia Hopkins to our clinic. The following note includes my evaluation and treatment recommendations.  HPI:   Chief Complaint: OBESITY    Sylvia Hopkins has been referred by [name], MD for consultation regarding her obesity and obesity related comorbidities.    Sylvia Hopkins (MR# [id number]) is a 62 y.o. female who presents on 04/07/2018 for obesity evaluation and treatment. Current BMI is Body mass index is 34.5 kg/m.Sylvia Hopkins has been struggling with her weight for many years and has been unsuccessful in either losing weight, maintaining weight loss, or reaching her healthy weight goal.     Mckinzey attended our information session and states she is currently in the action stage of change and ready to dedicate time achieving and maintaining a healthier weight. Sylvia Hopkins is interested in becoming our patient and working on intensive lifestyle modifications including (but not limited to) diet, exercise and weight loss.    Sylvia Hopkins states her family eats meals together she thinks her family will eat healthier with  her her desired weight loss is 51 to 56 lbs she started gaining weight 5 yrs ago her heaviest weight ever was 208 lbs. she has significant food cravings issues for fried chicken and cake she wakes up sometimes in the middle of the night to eat she skips meals frequently she is frequently drinking liquids with calories she has problems with excessive hunger  she struggles with emotional eating    Sylvia Hopkins feels her energy is lower than it should be. This has worsened with weight gain and has not worsened recently. Sylvia Hopkins admits to daytime somnolence and admits to waking up still tired. Sylvia Hopkins has a history of asthma and she uses her rescue inhaler once a month. Patient is at risk for obstructive sleep apnea. Patent has a history of symptoms of daytime Sylvia and morning Sylvia.  Patient generally gets 5 or 6 hours of sleep per night, and states they generally have restless sleep. Snoring is present. Apneic episodes are not present. Epworth Sleepiness Score is 2  Dyspnea on exertion [name] notes increasing shortness of breath with exercising and seems to be worsening over time with weight gain. She notes getting out of breath sooner with activity than she used to. This has not gotten worse recently. [name] denies orthopnea.  GERD (gastroesophageal reflux disease) with history of GI bleed Sylvia Hopkins has a diagnosis of gastroesophageal reflux disease and she is currently taking Pepcid and Nexium.  Vitamin D deficiency Sylvia Hopkins has a diagnosis of vitamin D deficiency. Her last vitamin D level on record was at 11. She is not currently taking vit D and denies nausea, vomiting or muscle weakness.  At risk for osteopenia and osteoporosis Sylvia Hopkins is at higher risk of osteopenia and osteoporosis due to vitamin D deficiency.   History of Hypothyroidism Sylvia Hopkins has a history of hypothyroidism. She was taken off levothyroxine per her endocrinologist. She denies hot or cold intolerance or palpitations, but does admit to ongoing Sylvia.  Elevated Blood Pressure Reading Sylvia Hopkins has an elevated blood pressure reading without a diagnosis of hypertension. Her last blood pressure reading was at 144/95.  Depression with emotional eating behaviors Sylvia Hopkins is struggling with emotional eating and using food for comfort to the extent that it is negatively impacting her health. She often snacks when she is not hungry. Sylvia Hopkins sometimes feels she is out of control and then feels guilty that  she made poor food choices. [name] has no trauma in her childhood. [name] has some mild depression and she has a mood and food score of 22. She is attempting to work on behavior modification techniques to help reduce her emotional eating. She shows no sign of suicidal or homicidal ideations.  Depression screen PHQ  2/9 04/03/2018  Decreased Interest 3  Down, Depressed, Hopeless 2  PHQ - 2 Score 5  Altered sleeping 3  Tired, decreased energy 3  Change in appetite 3  Feeling bad or failure about yourself  2  Trouble concentrating 2  Moving slowly or fidgety/restless 3  Suicidal thoughts 1  PHQ-9 Score 22  Difficult doing work/chores Very difficult       Depression Screen Sylvia Hopkins's Food and Mood (modified PHQ-9) score was  Depression screen PHQ 2/9 04/03/2018  Decreased Interest 3  Down, Depressed, Hopeless 2  PHQ - 2 Score 5  Altered sleeping 3  Tired, decreased energy 3  Change in appetite 3  Feeling bad or failure about yourself  2  Trouble concentrating 2  Moving slowly or fidgety/restless 3  Suicidal thoughts 1  PHQ-9 Score 22  Difficult doing work/chores Very difficult    ALLERGIES: Allergies  Allergen Reactions  . Advil [Ibuprofen] Other (See Comments)    Upset stomach     MEDICATIONS: Current Outpatient Medications on File Prior to Visit  Medication Sig Dispense Refill  . albuterol (PROVENTIL HFA;VENTOLIN HFA) 108 (90 BASE) MCG/ACT inhaler Inhale 2 puffs into the lungs every 6 (six) hours as needed for wheezing or shortness of breath.    . ALPRAZolam (XANAX) 0.5 MG tablet Take 0.5 mg by mouth as needed for anxiety.    Marland Kitchen amphetamine-dextroamphetamine (ADDERALL) 30 MG tablet Take 30 mg by mouth daily.     Marland Kitchen azelastine (ASTELIN) 0.1 % nasal spray Place 1 spray into both nostrils 2 (two) times daily. Use in each nostril as directed    . ciprofloxacin (CIPRO) 500 MG tablet Take 500 mg by mouth 2 (two) times daily.    . diphenhydrAMINE (BENADRYL ALLERGY) 25 MG tablet Take 25 mg by mouth every 6 (six) hours as needed.    Marland Kitchen esomeprazole (NEXIUM) 20 MG capsule Take 20 mg by mouth daily at 12 noon.    Marland Kitchen estradiol-norethindrone (COMBIPATCH) 0.05-0.14 MG/DAY Place 1 patch onto the skin 2 (two) times a week.    . famotidine (PEPCID AC) 10 MG chewable tablet Chew 10 mg by mouth daily.     . fluticasone (FLONASE) 50 MCG/ACT nasal spray Place 1 spray into both nostrils daily.    Marland Kitchen levocetirizine (XYZAL) 5 MG tablet Take 5 mg by mouth every evening.    . metroNIDAZOLE (FLAGYL) 500 MG tablet Take 500 mg by mouth 3 (three) times daily.    . mometasone-formoterol (DULERA) 100-5 MCG/ACT AERO daily.    . Multiple Vitamins-Minerals (MULTIVITAMIN WOMEN) TABS Take by mouth.    . Probiotic Product (PROBIOTIC ADVANCED PO) Take 1 tablet by mouth at bedtime.    . traMADol-acetaminophen (ULTRACET) 37.5-325 MG tablet Take 1 tablet by mouth every 6 (six) hours as needed.     No current facility-administered medications on file prior to visit.     PAST MEDICAL HISTORY: Past Medical History:  Diagnosis Date  . ADHD   . Alcohol use   . Anxiety   . Asthma due to environmental allergies   . Back pain   . Constipation   . Depression   . Diverticula of colon   .  Diverticulitis   . Sylvia   . GERD (gastroesophageal reflux disease)   . Hay fever   . HTN (hypertension)   . Hypothyroidism   . Joint pain   . Seasonal allergies   . Shortness of breath on exertion   . Swelling of extremity   . Thyroid disease   . Vision changes   . Vitamin D deficiency     PAST SURGICAL HISTORY: Past Surgical History:  Procedure Laterality Date  . CESAREAN SECTION     x2  . TONSILLECTOMY      SOCIAL HISTORY: Social History   Tobacco Use  . Smoking status: Never Smoker  . Smokeless tobacco: Never Used  Substance Use Topics  . Alcohol use: Yes    Comment: 2-3 drinks per day  . Drug use: No    FAMILY HISTORY: Family History  Problem Relation Age of Onset  . Emphysema Mother   . Depression Mother   . Anxiety disorder Mother   . Alcoholism Mother   . Prostate cancer Father   . Heart disease Father   . Hyperlipidemia Father   . Hypertension Father   . Cancer Father   . Hyperlipidemia Sister   . Heart disease Brother   . Hypertension Brother   . Hepatitis C Child      ROS: Review of Systems  Constitutional: Positive for malaise/Sylvia.  Respiratory: Positive for shortness of breath (on exertion).   Cardiovascular: Negative for palpitations and orthopnea.  Gastrointestinal: Negative for nausea and vomiting.  Musculoskeletal:       Negative for muscle weakness  Endo/Heme/Allergies:       Negative for hot or cold intolerance  Psychiatric/Behavioral: Positive for depression. Negative for suicidal ideas.    PHYSICAL EXAM: Blood pressure (!) 144/95, pulse 95, temperature 97.7 F (36.5 C), temperature source Oral, height 5\' 4"  (1.626 m), weight 201 lb (91.2 kg), SpO2 98 %. Body mass index is 34.5 kg/m. Physical Exam  Constitutional: She is oriented to person, place, and time. She appears well-developed and well-nourished.  HENT:  Nose: Nose normal.  Mallanpati = 2  Eyes: EOM are normal. No scleral icterus.  Neck: Normal range of motion. Neck supple. No thyromegaly present.  Cardiovascular: Normal rate and regular rhythm.  Pulmonary/Chest: Effort normal. No respiratory distress.  Abdominal: Soft. There is no tenderness.  + Obesity  Musculoskeletal: Normal range of motion.  Range of Motion normal in all 4 extremities  Neurological: She is alert and oriented to person, place, and time. Coordination normal.  Skin: Skin is warm and dry.  Psychiatric: She has a normal mood and affect. Her behavior is normal.  Vitals reviewed.   RECENT LABS AND TESTS: BMET    Component Value Date/Time   NA 142 01/20/2015 0345   K 3.5 01/20/2015 0345   CL 109 01/20/2015 0345   CO2 28 01/20/2015 0345   GLUCOSE 113 (H) 01/20/2015 0345   BUN <5 (L) 01/20/2015 0345   CREATININE 0.74 01/20/2015 0345   CALCIUM 9.1 01/20/2015 0345   GFRNONAA >60 01/20/2015 0345   GFRAA >60 01/20/2015 0345   Lab Results  Component Value Date   HGBA1C 5.5 04/03/2018   Lab Results  Component Value Date   INSULIN 8.2 04/03/2018   CBC    Component Value Date/Time   WBC  5.0 01/20/2015 0345   RBC 3.79 (L) 01/20/2015 0345   HGB 12.0 01/20/2015 0345   HCT 35.5 (L) 01/20/2015 0345   PLT 254 01/20/2015 0345   MCV  93.7 01/20/2015 0345   MCH 31.7 01/20/2015 0345   MCHC 33.8 01/20/2015 0345   RDW 13.5 01/20/2015 0345   Iron/TIBC/Ferritin/ %Sat    Component Value Date/Time   IRON 91 11/26/2016 1007   FERRITIN 39 11/26/2016 1007   Lipid Panel  No results found for: CHOL, TRIG, HDL, CHOLHDL, VLDL, LDLCALC, LDLDIRECT Hepatic Function Panel     Component Value Date/Time   PROT 6.9 01/17/2015 0032   ALBUMIN 4.3 01/17/2015 0032   AST 27 01/17/2015 0032   ALT 19 01/17/2015 0032   ALKPHOS 48 01/17/2015 0032   BILITOT 0.9 01/17/2015 0032   BILIDIR 0.1 01/17/2015 0032   IBILI 0.8 01/17/2015 0032      Component Value Date/Time   TSH 3.380 04/03/2018 1111   TSH 0.893 01/17/2015 0839    ECG  shows NSR with a rate of 95 BPM INDIRECT CALORIMETER done today shows a VO2 of 222 and a REE of 1553.  Her calculated basal metabolic rate is 4627 thus her basal metabolic rate is better than expected.    ASSESSMENT AND PLAN: Other Sylvia - Plan: EKG 12-Lead, Hemoglobin A1c, Insulin, random, Vitamin B12, Folate  Shortness of breath on exertion  Gastroesophageal reflux disease, esophagitis presence not specified  Vitamin D deficiency - Plan: VITAMIN D 25 Hydroxy (Vit-D Deficiency, Fractures)  History of hypothyroidism - Plan: T3, T4, free, TSH  Elevated blood pressure reading  Other depression - with emotional eating  At risk for osteoporosis  Class 1 obesity with serious comorbidity and body mass index (BMI) of 34.0 to 34.9 in adult, unspecified obesity type  PLAN: Sylvia Brittan was informed that her Sylvia may be related to obesity, depression or many other causes. Labs will be ordered, and in the meanwhile Patsy has agreed to work on diet and weight loss and will gradually increase exercise over time to help with Sylvia. Proper sleep hygiene was  discussed including the need for 7-8 hours of quality sleep each night. A sleep study was not ordered based on symptoms and Epworth score.  Dyspnea on exertion Phoebe's shortness of breath appears to be obesity related and exercise induced. She has agreed to work on weight loss and gradually increase exercise to treat her exercise induced shortness of breath. If Safiyya follows our instructions and loses weight without improvement of her shortness of breath, we will plan to refer to pulmonology. We will monitor this condition regularly. Nichole agrees to this plan.  GERD (gastroesophageal reflux disease) with history of GI bleed Merideth will continue Pepcid and Nexium and she will continue to follow up with [facility]. Aarna agrees to follow up with our clinic in 2 weeks.  Vitamin D Deficiency Ting was informed that low vitamin D levels contributes to Sylvia and are associated with obesity, breast, and colon cancer. She will follow up for routine testing of vitamin D, at least 2-3 times per year. We will check vitamin D level and begin vit D supplement if needed. Nimco will follow up with our clinic in 2 weeks.  At risk for osteopenia and osteoporosis Mackensie was given extended  (15 minutes) osteoporosis prevention counseling today. Druanne is at risk for osteopenia and osteoporosis due to her vitamin D deficiency. She was encouraged to take her vitamin D and follow her higher calcium diet and increase strengthening exercise to help strengthen her bones and decrease her risk of osteopenia and osteoporosis.  History of Hypothyroidism Makeya was informed of the importance of good thyroid control to help  with weight loss efforts. She was also informed that supertheraputic thyroid levels are dangerous and will not improve weight loss results. We will check thyroid level and Shaguana will follow up at the agreed upon time.  Elevated Blood Pressure Reading We discussed sodium restriction, working on  healthy weight loss, and a regular exercise program as the means to achieve improved blood pressure control. Roneshia agreed with this plan and agreed to follow up as directed. We will continue to monitor her blood pressure as well as her progress with the above lifestyle modifications.   Depression with Emotional Eating Behaviors We discussed behavior modification techniques today to help Patriciann deal with her emotional eating and depression. We will refer to Dr. Mallie Mussel, our bariatric psychologist and Heena will follow up with our clinic in 2 weeks.  Depression Screen Natane had a strongly positive depression screening. Depression is commonly associated with obesity and often results in emotional eating behaviors. We will monitor this closely and work on CBT to help improve the non-hunger eating patterns.   Obesity Sula is currently in the action stage of change and her goal is to continue with weight loss efforts. I recommend Chatara begin the structured treatment plan as follows:  She has agreed to follow the Category 2 plan Jalessa has been instructed to eventually work up to a goal of 150 minutes of combined cardio and strengthening exercise per week for weight loss and overall health benefits. We discussed the following Behavioral Modification Strategies today: increase H2O intake, no skipping meals, increasing lean protein intake, work on meal planning and easy cooking plans and ways to avoid night time snacking   She was informed of the importance of frequent follow up visits to maximize her success with intensive lifestyle modifications for her multiple health conditions. She was informed we would discuss her lab results at her next visit unless there is a critical issue that needs to be addressed sooner. Jackqueline agreed to keep her next visit at the agreed upon time to discuss these results.    OBESITY BEHAVIORAL INTERVENTION VISIT  Today's visit was # 1   Starting weight: 201  lbs Starting date: 04/07/18 Today's weight : 201 lbs  Today's date: 04/03/2018 Total lbs lost to date: 0   ASK: We discussed the diagnosis of obesity with Sylvia Hopkins today and Niti agreed to give [place] permission to discuss obesity behavioral modification therapy today.  ASSESS: Mikita has the diagnosis of obesity and her BMI today is 34.48 Hannalee is in the action stage of change   ADVISE: Zaylie was educated on the multiple health risks of obesity as well as the benefit of weight loss to improve her health. She was advised of the need for long term treatment and the importance of lifestyle modifications to improve her current health and to decrease her risk of future health problems.  AGREE: Multiple dietary modification options and treatment options were discussed and  Mckell agreed to follow the recommendations documented in the above note.  ARRANGE: Lichelle was educated on the importance of frequent visits to treat obesity as outlined per CMS and USPSTF guidelines and agreed to schedule her next follow up appointment today.  Corey Skains, am acting as Location manager for [employer]. Owens Shark, DO  I have reviewed the above documentation for accuracy and completeness, and I agree with the above. -Jearld Lesch, DO

## 2018-04-09 DIAGNOSIS — K5792 Diverticulitis of intestine, part unspecified, without perforation or abscess without bleeding: Secondary | ICD-10-CM | POA: Diagnosis not present

## 2018-04-09 DIAGNOSIS — K219 Gastro-esophageal reflux disease without esophagitis: Secondary | ICD-10-CM | POA: Diagnosis not present

## 2018-04-14 DIAGNOSIS — J4520 Mild intermittent asthma, uncomplicated: Secondary | ICD-10-CM | POA: Diagnosis not present

## 2018-04-14 DIAGNOSIS — Z23 Encounter for immunization: Secondary | ICD-10-CM | POA: Diagnosis not present

## 2018-04-17 ENCOUNTER — Ambulatory Visit (INDEPENDENT_AMBULATORY_CARE_PROVIDER_SITE_OTHER): Payer: 59 | Admitting: Bariatrics

## 2018-04-17 ENCOUNTER — Encounter (INDEPENDENT_AMBULATORY_CARE_PROVIDER_SITE_OTHER): Payer: Self-pay | Admitting: Bariatrics

## 2018-04-17 VITALS — BP 157/99 | HR 109 | Temp 98.2°F | Ht 64.0 in | Wt 195.0 lb

## 2018-04-17 DIAGNOSIS — E669 Obesity, unspecified: Secondary | ICD-10-CM | POA: Diagnosis not present

## 2018-04-17 DIAGNOSIS — E559 Vitamin D deficiency, unspecified: Secondary | ICD-10-CM | POA: Diagnosis not present

## 2018-04-17 DIAGNOSIS — Z6833 Body mass index (BMI) 33.0-33.9, adult: Secondary | ICD-10-CM

## 2018-04-17 DIAGNOSIS — Z9189 Other specified personal risk factors, not elsewhere classified: Secondary | ICD-10-CM | POA: Diagnosis not present

## 2018-04-17 DIAGNOSIS — I10 Essential (primary) hypertension: Secondary | ICD-10-CM | POA: Diagnosis not present

## 2018-04-17 MED ORDER — VITAMIN D (ERGOCALCIFEROL) 1.25 MG (50000 UNIT) PO CAPS: 50000.0000 [IU] | ORAL_CAPSULE | ORAL | 0 refills | Status: DC

## 2018-04-21 DIAGNOSIS — Z6832 Body mass index (BMI) 32.0-32.9, adult: Secondary | ICD-10-CM

## 2018-04-21 DIAGNOSIS — E669 Obesity, unspecified: Secondary | ICD-10-CM | POA: Insufficient documentation

## 2018-04-21 NOTE — Progress Notes (Signed)
Office: [phone]  /  Fax: [phone]   HPI:   Chief Complaint: OBESITY Sylvia Hopkins is here to discuss her progress with her obesity treatment plan. She is on the Category 2 plan and is following her eating plan approximately 75 % of the time. She states she is exercising 0 minutes 0 times per week. Sylvia Hopkins is finding that it is "hard to eat all of the food". She is having minimal cravings and her hunger is controlled.  Her weight is 195 lb (88.5 kg) today and has had a weight loss of 6 pounds over a period of 2 weeks since her last visit. She has lost 6 lbs since starting treatment with Sylvia Hopkins.  Vitamin D deficiency Sylvia Hopkins has a diagnosis of vitamin D deficiency. Her vitamin D level is 26.9 on 04/03/18. She has taken vitamin D previously in the past, but she is not currently taking vit D now.  At risk for osteopenia and osteoporosis Sylvia Hopkins is at higher risk of osteopenia and osteoporosis due to vitamin D deficiency.   Hypertension Sylvia Hopkins is a 62 y.o. female with hypertension. She is working on weight loss to help control her blood pressure with the goal of decreasing her risk of heart attack and stroke. Sylvia Hopkins's blood pressure is not currently controlled at 157/99. Her last reading was 144/95.Her blood pressure was controlled at other visits. Per Sylvia Hopkins, she has had increased stress.  ALLERGIES: Allergies  Allergen Reactions  . Advil [Ibuprofen] Other (See Comments)    Upset stomach     MEDICATIONS: Current Outpatient Medications on File Prior to Visit  Medication Sig Dispense Refill  . albuterol (PROVENTIL HFA;VENTOLIN HFA) 108 (90 BASE) MCG/ACT inhaler Inhale 2 puffs into the lungs every 6 (six) hours as needed for wheezing or shortness of breath.    . ALPRAZolam (XANAX) 0.5 MG tablet Take 0.5 mg by mouth as needed for anxiety.    Sylvia Hopkins amphetamine-dextroamphetamine (ADDERALL) 30 MG tablet Take 30 mg by mouth daily.     Sylvia Hopkins azelastine (ASTELIN) 0.1 % nasal spray Place 1 spray into  both nostrils 2 (two) times daily. Use in each nostril as directed    . diphenhydrAMINE (BENADRYL ALLERGY) 25 MG tablet Take 25 mg by mouth every 6 (six) hours as needed.    Sylvia Hopkins esomeprazole (NEXIUM) 20 MG capsule Take 20 mg by mouth daily at 12 noon.    Sylvia Hopkins estradiol-norethindrone (COMBIPATCH) 0.05-0.14 MG/DAY Place 1 patch onto the skin 2 (two) times a week.    . famotidine (PEPCID AC) 10 MG chewable tablet Chew 10 mg by mouth daily.    . fluticasone (FLONASE) 50 MCG/ACT nasal spray Place 1 spray into both nostrils daily.    Sylvia Hopkins levocetirizine (XYZAL) 5 MG tablet Take 5 mg by mouth every evening.    . mometasone-formoterol (DULERA) 100-5 MCG/ACT AERO daily.    . Multiple Vitamins-Minerals (MULTIVITAMIN WOMEN) TABS Take by mouth.    . Probiotic Product (PROBIOTIC ADVANCED PO) Take 1 tablet by mouth at bedtime.    . traMADol-acetaminophen (ULTRACET) 37.5-325 MG tablet Take 1 tablet by mouth every 6 (six) hours as needed.     No current facility-administered medications on file prior to visit.     PAST MEDICAL HISTORY: Past Medical History:  Diagnosis Date  . ADHD   . Alcohol use   . Anxiety   . Asthma due to environmental allergies   . Back pain   . Constipation   . Depression   . Diverticula of colon   .  Diverticulitis   . Fatigue   . GERD (gastroesophageal reflux disease)   . Hay fever   . HTN (hypertension)   . Hypothyroidism   . Joint pain   . Seasonal allergies   . Shortness of breath on exertion   . Swelling of extremity   . Thyroid disease   . Vision changes   . Vitamin D deficiency     PAST SURGICAL HISTORY: Past Surgical History:  Procedure Laterality Date  . CESAREAN SECTION     x2  . TONSILLECTOMY      SOCIAL HISTORY: Social History   Tobacco Use  . Smoking status: Never Smoker  . Smokeless tobacco: Never Used  Substance Use Topics  . Alcohol use: Yes    Comment: 2-3 drinks per day  . Drug use: No    FAMILY HISTORY: Family History  Problem Relation  Age of Onset  . Emphysema Mother   . Depression Mother   . Anxiety disorder Mother   . Alcoholism Mother   . Prostate cancer Father   . Heart disease Father   . Hyperlipidemia Father   . Hypertension Father   . Cancer Father   . Hyperlipidemia Sister   . Heart disease Brother   . Hypertension Brother   . Hepatitis C Child     ROS: Review of Systems  Constitutional: Positive for weight loss.    PHYSICAL EXAM: Blood pressure (!) 157/99, pulse (!) 109, temperature 98.2 F (36.8 C), temperature source Oral, height 5\' 4"  (1.626 m), weight 195 lb (88.5 kg), SpO2 99 %. Body mass index is 33.47 kg/m. Physical Exam  Constitutional: She is oriented to person, place, and time. She appears well-developed and well-nourished.  Cardiovascular: Normal rate.  Pulmonary/Chest: Effort normal.  Musculoskeletal: Normal range of motion.  Neurological: She is oriented to person, place, and time.  Skin: Skin is warm and dry.  Psychiatric: She has a normal mood and affect. Her behavior is normal.  Vitals reviewed.   RECENT LABS AND TESTS: BMET    Component Value Date/Time   NA 142 01/20/2015 0345   K 3.5 01/20/2015 0345   CL 109 01/20/2015 0345   CO2 28 01/20/2015 0345   GLUCOSE 113 (H) 01/20/2015 0345   BUN <5 (L) 01/20/2015 0345   CREATININE 0.74 01/20/2015 0345   CALCIUM 9.1 01/20/2015 0345   GFRNONAA >60 01/20/2015 0345   GFRAA >60 01/20/2015 0345   Lab Results  Component Value Date   HGBA1C 5.5 04/03/2018   Lab Results  Component Value Date   INSULIN 8.2 04/03/2018   CBC    Component Value Date/Time   WBC 5.0 01/20/2015 0345   RBC 3.79 (L) 01/20/2015 0345   HGB 12.0 01/20/2015 0345   HCT 35.5 (L) 01/20/2015 0345   PLT 254 01/20/2015 0345   MCV 93.7 01/20/2015 0345   MCH 31.7 01/20/2015 0345   MCHC 33.8 01/20/2015 0345   RDW 13.5 01/20/2015 0345   Iron/TIBC/Ferritin/ %Sat    Component Value Date/Time   IRON 91 11/26/2016 1007   FERRITIN 39 11/26/2016 1007    Lipid Panel  No results found for: CHOL, TRIG, HDL, CHOLHDL, VLDL, LDLCALC, LDLDIRECT Hepatic Function Panel     Component Value Date/Time   PROT 6.9 01/17/2015 0032   ALBUMIN 4.3 01/17/2015 0032   AST 27 01/17/2015 0032   ALT 19 01/17/2015 0032   ALKPHOS 48 01/17/2015 0032   BILITOT 0.9 01/17/2015 0032   BILIDIR 0.1 01/17/2015 0032   IBILI 0.8  01/17/2015 0032      Component Value Date/Time   TSH 3.380 04/03/2018 1111   TSH 0.893 01/17/2015 0839   Results for LUCINA, BETTY (MRN [id number]) as of 04/21/2018 14:09  Ref. Range 04/03/2018 11:11  Vitamin D, 25-Hydroxy Latest Ref Range: 30.0 - 100.0 ng/mL 26.9 (L)   ASSESSMENT AND PLAN: Vitamin D deficiency - Plan: Vitamin D, Ergocalciferol, (DRISDOL) 50000 units CAPS capsule  Essential hypertension  At risk for osteoporosis  Class 1 obesity with serious comorbidity and body mass index (BMI) of 33.0 to 33.9 in adult, unspecified obesity type  PLAN:  Vitamin D Deficiency Faige was informed that low vitamin D levels contributes to fatigue and are associated with obesity, breast, and colon cancer. She agrees to continue to take prescription Vit D @50 ,000 IU, 1 capsule every week #4 with no refills and will follow up for routine testing of vitamin D, at least 2-3 times per year. She was informed of the risk of over-replacement of vitamin D and agrees to not increase her dose unless she discusses this with Sylvia Hopkins first. Caleah agrees to follow up in 2 weeks.  At risk for osteopenia and osteoporosis Petina was given extended  (15 minutes) osteoporosis prevention counseling today. Lucelia is at risk for osteopenia and osteoporosis due to her vitamin D deficiency. She was encouraged to take her vitamin D and follow her higher calcium diet and increase strengthening exercise to help strengthen her bones and decrease her risk of osteopenia and osteoporosis.  Hypertension We discussed sodium restriction, working on healthy weight loss, and a  regular exercise program as the means to achieve improved blood pressure control.  We will continue to monitor her blood pressure as well as her progress with the above lifestyle modifications. She will continue her medications as prescribed and will watch for signs of hypotension as she continues her lifestyle modifications. Felissa agrees to no added salt and will continue to check her blood pressure. If her blood pressure stays elevated, we may consider blood pressure medications. Teona agreed with this plan and agreed to follow up as directed.  Obesity Naara is currently in the action stage of change. As such, her goal is to continue with weight loss efforts. She has agreed to follow the Category 2 plan. Klee has been instructed to work up to a goal of 150 minutes of combined cardio and strengthening exercise per week for weight loss and overall health benefits. We discussed the following Behavioral Modification Strategies today: increasing lean protein intake, increase H2O intake, no skipping meals, work on meal planning and easy cooking plans, and better snacking choices.  Chrisa has agreed to follow up with our clinic in 2 weeks. She was informed of the importance of frequent follow up visits to maximize her success with intensive lifestyle modifications for her multiple health conditions.   OBESITY BEHAVIORAL INTERVENTION VISIT  Today's visit was # 2   Starting weight: 201 lbs Starting date: 04/03/18 Today's weight : Weight: 195 lb (88.5 kg)  Today's date: 04/17/2018 Total lbs lost to date: 6  ASK: We discussed the diagnosis of obesity with Grafton Folk today and Seraya agreed to give Sylvia Hopkins permission to discuss obesity behavioral modification therapy today.  ASSESS: Makyra has the diagnosis of obesity and her BMI today is 33.46. Evelette is not in the action stage of change.   ADVISE: Alaena was educated on the multiple health risks of obesity as well as the benefit of weight  loss to improve her  health. She was advised of the need for long term treatment and the importance of lifestyle modifications to improve her current health and to decrease her risk of future health problems.  AGREE: Multiple dietary modification options and treatment options were discussed and Melaina agreed to follow the recommendations documented in the above note.  ARRANGE: Terrance was educated on the importance of frequent visits to treat obesity as outlined per CMS and USPSTF guidelines and agreed to schedule her next follow up appointment today.  I, Marcille Blanco, am acting as Location manager for [employer]. Owens Shark, DO  I have reviewed the above documentation for accuracy and completeness, and I agree with the above. -Jearld Lesch, DO

## 2018-04-29 DIAGNOSIS — Z01419 Encounter for gynecological examination (general) (routine) without abnormal findings: Secondary | ICD-10-CM | POA: Diagnosis not present

## 2018-04-29 DIAGNOSIS — Z6835 Body mass index (BMI) 35.0-35.9, adult: Secondary | ICD-10-CM | POA: Diagnosis not present

## 2018-04-29 DIAGNOSIS — N950 Postmenopausal bleeding: Secondary | ICD-10-CM | POA: Diagnosis not present

## 2018-05-05 ENCOUNTER — Encounter (INDEPENDENT_AMBULATORY_CARE_PROVIDER_SITE_OTHER): Payer: Self-pay

## 2018-05-05 ENCOUNTER — Ambulatory Visit (INDEPENDENT_AMBULATORY_CARE_PROVIDER_SITE_OTHER): Payer: Self-pay | Admitting: Bariatrics

## 2018-05-14 ENCOUNTER — Ambulatory Visit (INDEPENDENT_AMBULATORY_CARE_PROVIDER_SITE_OTHER): Payer: 59 | Admitting: Bariatrics

## 2018-05-14 VITALS — BP 145/88 | HR 96 | Temp 98.3°F | Ht 64.0 in | Wt 199.0 lb

## 2018-05-14 DIAGNOSIS — E559 Vitamin D deficiency, unspecified: Secondary | ICD-10-CM | POA: Diagnosis not present

## 2018-05-14 DIAGNOSIS — E669 Obesity, unspecified: Secondary | ICD-10-CM

## 2018-05-14 DIAGNOSIS — Z9189 Other specified personal risk factors, not elsewhere classified: Secondary | ICD-10-CM

## 2018-05-14 DIAGNOSIS — I10 Essential (primary) hypertension: Secondary | ICD-10-CM | POA: Diagnosis not present

## 2018-05-14 DIAGNOSIS — Z6834 Body mass index (BMI) 34.0-34.9, adult: Secondary | ICD-10-CM

## 2018-05-14 MED ORDER — VITAMIN D (ERGOCALCIFEROL) 1.25 MG (50000 UNIT) PO CAPS: 50000.0000 [IU] | ORAL_CAPSULE | ORAL | 0 refills | Status: DC

## 2018-05-14 MED ORDER — HYDROCHLOROTHIAZIDE 12.5 MG PO TABS: 12.5000 mg | ORAL_TABLET | Freq: Every day | ORAL | 0 refills | Status: DC

## 2018-05-15 NOTE — Progress Notes (Signed)
Office: [phone]  /  Fax: [phone]   HPI:   Chief Complaint: OBESITY Sylvia Hopkins is here to discuss her progress with her obesity treatment plan. She is on the Category 2 plan and is following her eating plan approximately 80 % of the time. She states she is exercising 0 minutes 0 times per week. Sylvia Hopkins states that she is under more stress. She has been getting home late. She has had some hunger and some cravings at night. She has increased her water intake.  Her weight is 199 lb (90.3 kg) today and has had a weight gain of 4 pounds over a period of 4 weeks since her last visit. She has lost 2 lbs since starting treatment with [place].  Vitamin D deficiency Sylvia Hopkins has a diagnosis of vitamin D deficiency. She is currently taking high dose vit D and her last vitamin D level was 26.9 on 04/03/18. She denies nausea, vomiting, or muscle weakness.  Hypertension Sylvia Hopkins is a 62 y.o. female with hypertension. Sylvia Hopkins blood pressure is not well controlled. She is working on weight loss to help control her blood pressure with the goal of decreasing her risk of heart attack and stroke. She has never been on any medications for hypertension.  At risk for cardiovascular disease Sylvia Hopkins is at a higher than average risk for cardiovascular disease due to hypertension and obesity. She currently denies any chest pain.  ALLERGIES: Allergies  Allergen Reactions  . Advil [Ibuprofen] Other (See Comments)    Upset stomach     MEDICATIONS: Current Outpatient Medications on File Prior to Visit  Medication Sig Dispense Refill  . albuterol (PROVENTIL HFA;VENTOLIN HFA) 108 (90 BASE) MCG/ACT inhaler Inhale 2 puffs into the lungs every 6 (six) hours as needed for wheezing or shortness of breath.    . ALPRAZolam (XANAX) 0.5 MG tablet Take 0.5 mg by mouth as needed for anxiety.    Marland Kitchen amphetamine-dextroamphetamine (ADDERALL) 30 MG tablet Take 30 mg by mouth daily.     Marland Kitchen azelastine (ASTELIN) 0.1 % nasal spray  Place 1 spray into both nostrils 2 (two) times daily. Use in each nostril as directed    . diphenhydrAMINE (BENADRYL ALLERGY) 25 MG tablet Take 25 mg by mouth every 6 (six) hours as needed.    Marland Kitchen esomeprazole (NEXIUM) 20 MG capsule Take 20 mg by mouth daily at 12 noon.    Marland Kitchen ESTRADIOL-NORETHINDRONE ACET TD Place onto the skin. 0.025-0.14 mg place one patch on skin two times wkly    . famotidine (PEPCID AC) 10 MG chewable tablet Chew 10 mg by mouth daily.    . fluticasone (FLONASE) 50 MCG/ACT nasal spray Place 1 spray into both nostrils daily.    Marland Kitchen levocetirizine (XYZAL) 5 MG tablet Take 5 mg by mouth every evening.    . mometasone-formoterol (DULERA) 100-5 MCG/ACT AERO daily.    . Multiple Vitamins-Minerals (MULTIVITAMIN WOMEN) TABS Take by mouth.    . Probiotic Product (PROBIOTIC ADVANCED PO) Take 1 tablet by mouth at bedtime.    . traMADol-acetaminophen (ULTRACET) 37.5-325 MG tablet Take 1 tablet by mouth every 6 (six) hours as needed.     No current facility-administered medications on file prior to visit.     PAST MEDICAL HISTORY: Past Medical History:  Diagnosis Date  . ADHD   . Alcohol use   . Anxiety   . Asthma due to environmental allergies   . Back pain   . Constipation   . Depression   . Diverticula  of colon   . Diverticulitis   . Fatigue   . GERD (gastroesophageal reflux disease)   . Hay fever   . HTN (hypertension)   . Hypothyroidism   . Joint pain   . Seasonal allergies   . Shortness of breath on exertion   . Swelling of extremity   . Thyroid disease   . Vision changes   . Vitamin D deficiency     PAST SURGICAL HISTORY: Past Surgical History:  Procedure Laterality Date  . CESAREAN SECTION     x2  . TONSILLECTOMY      SOCIAL HISTORY: Social History   Tobacco Use  . Smoking status: Never Smoker  . Smokeless tobacco: Never Used  Substance Use Topics  . Alcohol use: Yes    Comment: 2-3 drinks per day  . Drug use: No    FAMILY HISTORY: Family  History  Problem Relation Age of Onset  . Emphysema Mother   . Depression Mother   . Anxiety disorder Mother   . Alcoholism Mother   . Prostate cancer Father   . Heart disease Father   . Hyperlipidemia Father   . Hypertension Father   . Cancer Father   . Hyperlipidemia Sister   . Heart disease Brother   . Hypertension Brother   . Hepatitis C Child     ROS: Review of Systems  Constitutional: Negative for weight loss.  Cardiovascular: Negative for chest pain.  Gastrointestinal: Negative for nausea and vomiting.  Musculoskeletal:       Negative for muscle weakness.    PHYSICAL EXAM: Blood pressure (!) 145/88, pulse 96, temperature 98.3 F (36.8 C), temperature source Oral, height 5\' 4"  (1.626 m), weight 199 lb (90.3 kg), SpO2 96 %. Body mass index is 34.16 kg/m. Physical Exam  Constitutional: She is oriented to person, place, and time. She appears well-developed and well-nourished.  Cardiovascular: Normal rate.  Pulmonary/Chest: Effort normal.  Musculoskeletal: Normal range of motion.  Neurological: She is oriented to person, place, and time.  Skin: Skin is warm and dry.  Psychiatric: She has a normal mood and affect. Her behavior is normal.  Vitals reviewed.   RECENT LABS AND TESTS: BMET    Component Value Date/Time   NA 142 01/20/2015 0345   K 3.5 01/20/2015 0345   CL 109 01/20/2015 0345   CO2 28 01/20/2015 0345   GLUCOSE 113 (H) 01/20/2015 0345   BUN <5 (L) 01/20/2015 0345   CREATININE 0.74 01/20/2015 0345   CALCIUM 9.1 01/20/2015 0345   GFRNONAA >60 01/20/2015 0345   GFRAA >60 01/20/2015 0345   Lab Results  Component Value Date   HGBA1C 5.5 04/03/2018   Lab Results  Component Value Date   INSULIN 8.2 04/03/2018   CBC    Component Value Date/Time   WBC 5.0 01/20/2015 0345   RBC 3.79 (L) 01/20/2015 0345   HGB 12.0 01/20/2015 0345   HCT 35.5 (L) 01/20/2015 0345   PLT 254 01/20/2015 0345   MCV 93.7 01/20/2015 0345   MCH 31.7 01/20/2015 0345    MCHC 33.8 01/20/2015 0345   RDW 13.5 01/20/2015 0345   Iron/TIBC/Ferritin/ %Sat    Component Value Date/Time   IRON 91 11/26/2016 1007   FERRITIN 39 11/26/2016 1007   Lipid Panel  No results found for: CHOL, TRIG, HDL, CHOLHDL, VLDL, LDLCALC, LDLDIRECT Hepatic Function Panel     Component Value Date/Time   PROT 6.9 01/17/2015 0032   ALBUMIN 4.3 01/17/2015 0032   AST 27 01/17/2015  0032   ALT 19 01/17/2015 0032   ALKPHOS 48 01/17/2015 0032   BILITOT 0.9 01/17/2015 0032   BILIDIR 0.1 01/17/2015 0032   IBILI 0.8 01/17/2015 0032      Component Value Date/Time   TSH 3.380 04/03/2018 1111   TSH 0.893 01/17/2015 0839   Results for PROVIDENCIA, HOTTENSTEIN (MRN [id number]) as of 05/15/2018 14:55  Ref. Range 04/03/2018 11:11  Vitamin D, 25-Hydroxy Latest Ref Range: 30.0 - 100.0 ng/mL 26.9 (L)   ASSESSMENT AND PLAN: Vitamin D deficiency - Plan: Vitamin D, Ergocalciferol, (DRISDOL) 1.25 MG (50000 UT) CAPS capsule  Essential hypertension - Plan: hydrochlorothiazide (HYDRODIURIL) 12.5 MG tablet  At risk for heart disease  Class 1 obesity with serious comorbidity and body mass index (BMI) of 34.0 to 34.9 in adult, unspecified obesity type  PLAN:  Vitamin D Deficiency Sylvia Hopkins was informed that low vitamin D levels contributes to fatigue and are associated with obesity, breast, and colon cancer. She agrees to continue to take prescription Vit D @50 ,000 IU every week # 4 with no refills and will follow up for routine testing of vitamin D, at least 2-3 times per year. She was informed of the risk of over-replacement of vitamin D and agrees to not increase her dose unless she discusses this with [place] first. Sylvia Hopkins agrees to follow up in 2 weeks.  Hypertension We discussed sodium restriction, working on healthy weight loss, and a regular exercise program as the means to achieve improved blood pressure control. We will continue to monitor her blood pressure as well as her progress with the above lifestyle  modifications. She will start HCTZ 12.5mg  qAM #30 with no refills and will watch for signs of hypotension as she continues her lifestyle modifications. Sylvia Hopkins agreed with this plan and agreed to follow up as directed.  Cardiovascular risk counseling Sylvia Hopkins was given extended (15 minutes) coronary artery disease prevention counseling today. She is 62 y.o. female and has risk factors for heart disease including hypertension and obesity. We discussed intensive lifestyle modifications today with an emphasis on specific weight loss instructions and strategies. Pt was also informed of the importance of increasing exercise and decreasing saturated fats to help prevent heart disease.  Obesity Sylvia Hopkins is currently in the action stage of change. As such, her goal is to continue with weight loss efforts. She has agreed to follow the Category 2 plan. She will increase her water intake and decrease sodium. Sylvia Hopkins has been instructed to work up to a goal of 150 minutes of combined cardio and strengthening exercise per week for weight loss and overall health benefits. We discussed the following Behavioral Modification Strategies today: increasing lean protein intake, decreasing simple carbohydrates, increasing vegetables, and increase H2O intake.  Sylvia Hopkins has agreed to follow up with our clinic in 2 weeks. She was informed of the importance of frequent follow up visits to maximize her success with intensive lifestyle modifications for her multiple health conditions.   OBESITY BEHAVIORAL INTERVENTION VISIT  Today's visit was # 3   Starting weight: 201 lbs Starting date: 04/03/18 Today's weight : Weight: 199 lb (90.3 kg)  Today's date: 05/14/2018 Total lbs lost to date: 2  ASK: We discussed the diagnosis of obesity with Sylvia Hopkins today and Sylvia Hopkins agreed to give [place] permission to discuss obesity behavioral modification therapy today.  ASSESS: Sylvia Hopkins has the diagnosis of obesity and her BMI today is  34.14. Sylvia Hopkins is in the action stage of change.   ADVISE: Sylvia Hopkins was educated on  the multiple health risks of obesity as well as the benefit of weight loss to improve her health. She was advised of the need for long term treatment and the importance of lifestyle modifications to improve her current health and to decrease her risk of future health problems.  AGREE: Multiple dietary modification options and treatment options were discussed and Sylvia Hopkins agreed to follow the recommendations documented in the above note.  ARRANGE: Sylvia Hopkins was educated on the importance of frequent visits to treat obesity as outlined per CMS and USPSTF guidelines and agreed to schedule her next follow up appointment today.  I, Marcille Blanco, am acting as Location manager for [employer]. Owens Shark, DO  I have reviewed the above documentation for accuracy and completeness, and I agree with the above. -Jearld Lesch, DO

## 2018-05-22 ENCOUNTER — Other Ambulatory Visit (INDEPENDENT_AMBULATORY_CARE_PROVIDER_SITE_OTHER): Payer: Self-pay | Admitting: Bariatrics

## 2018-05-22 ENCOUNTER — Encounter (INDEPENDENT_AMBULATORY_CARE_PROVIDER_SITE_OTHER): Payer: Self-pay | Admitting: Bariatrics

## 2018-05-22 MED ORDER — AMPHETAMINE-DEXTROAMPHETAMINE 30 MG PO TABS: 30.0000 mg | ORAL_TABLET | Freq: Two times a day (BID) | ORAL | 0 refills | Status: DC

## 2018-05-29 ENCOUNTER — Ambulatory Visit (INDEPENDENT_AMBULATORY_CARE_PROVIDER_SITE_OTHER): Payer: 59 | Admitting: Bariatrics

## 2018-05-29 ENCOUNTER — Encounter (INDEPENDENT_AMBULATORY_CARE_PROVIDER_SITE_OTHER): Payer: Self-pay

## 2018-05-30 DIAGNOSIS — N888 Other specified noninflammatory disorders of cervix uteri: Secondary | ICD-10-CM | POA: Diagnosis not present

## 2018-05-30 DIAGNOSIS — N950 Postmenopausal bleeding: Secondary | ICD-10-CM | POA: Diagnosis not present

## 2018-06-11 ENCOUNTER — Ambulatory Visit (INDEPENDENT_AMBULATORY_CARE_PROVIDER_SITE_OTHER): Payer: 59 | Admitting: Bariatrics

## 2018-06-11 ENCOUNTER — Encounter (INDEPENDENT_AMBULATORY_CARE_PROVIDER_SITE_OTHER): Payer: Self-pay | Admitting: Bariatrics

## 2018-06-11 VITALS — BP 136/87 | HR 86 | Temp 97.6°F | Ht 64.0 in | Wt 194.0 lb

## 2018-06-11 DIAGNOSIS — Z6833 Body mass index (BMI) 33.0-33.9, adult: Secondary | ICD-10-CM

## 2018-06-11 DIAGNOSIS — R7309 Other abnormal glucose: Secondary | ICD-10-CM

## 2018-06-11 DIAGNOSIS — Z9189 Other specified personal risk factors, not elsewhere classified: Secondary | ICD-10-CM

## 2018-06-11 DIAGNOSIS — I10 Essential (primary) hypertension: Secondary | ICD-10-CM | POA: Diagnosis not present

## 2018-06-11 DIAGNOSIS — E559 Vitamin D deficiency, unspecified: Secondary | ICD-10-CM

## 2018-06-11 DIAGNOSIS — E669 Obesity, unspecified: Secondary | ICD-10-CM

## 2018-06-11 MED ORDER — VITAMIN D (ERGOCALCIFEROL) 1.25 MG (50000 UNIT) PO CAPS: 50000.0000 [IU] | ORAL_CAPSULE | ORAL | 0 refills | Status: DC

## 2018-06-12 LAB — COMPREHENSIVE METABOLIC PANEL
A/G RATIO: 2.7 — AB (ref 1.2–2.2)
ALBUMIN: 4.8 g/dL (ref 3.6–4.8)
ALT: 21 IU/L (ref 0–32)
AST: 15 IU/L (ref 0–40)
Alkaline Phosphatase: 53 IU/L (ref 39–117)
BUN / CREAT RATIO: 25 (ref 12–28)
BUN: 19 mg/dL (ref 8–27)
Bilirubin Total: 0.3 mg/dL (ref 0.0–1.2)
CALCIUM: 9.7 mg/dL (ref 8.7–10.3)
CO2: 22 mmol/L (ref 20–29)
CREATININE: 0.76 mg/dL (ref 0.57–1.00)
Chloride: 97 mmol/L (ref 96–106)
GFR, EST AFRICAN AMERICAN: 96 mL/min/{1.73_m2} (ref >59)
GFR, EST NON AFRICAN AMERICAN: 83 mL/min/{1.73_m2} (ref >59)
GLOBULIN, TOTAL: 1.8 g/dL (ref 1.5–4.5)
Glucose: 111 mg/dL — ABNORMAL HIGH (ref 65–99)
POTASSIUM: 4.1 mmol/L (ref 3.5–5.2)
SODIUM: 138 mmol/L (ref 134–144)
TOTAL PROTEIN: 6.6 g/dL (ref 6.0–8.5)

## 2018-06-12 LAB — INSULIN, RANDOM: INSULIN: 9.8 u[IU]/mL (ref 2.6–24.9)

## 2018-06-12 LAB — LIPID PANEL WITH LDL/HDL RATIO
CHOLESTEROL TOTAL: 180 mg/dL (ref 100–199)
HDL: 38 mg/dL — ABNORMAL LOW (ref >39)
LDL CALC: 98 mg/dL (ref 0–99)
LDL/HDL RATIO: 2.6 ratio (ref 0.0–3.2)
Triglycerides: 222 mg/dL — ABNORMAL HIGH (ref 0–149)
VLDL Cholesterol Cal: 44 mg/dL — ABNORMAL HIGH (ref 5–40)

## 2018-06-12 LAB — VITAMIN D 25 HYDROXY (VIT D DEFICIENCY, FRACTURES): Vit D, 25-Hydroxy: 39 ng/mL (ref 30.0–100.0)

## 2018-06-12 LAB — HEMOGLOBIN A1C
ESTIMATED AVERAGE GLUCOSE: 105 mg/dL
HEMOGLOBIN A1C: 5.3 % (ref 4.8–5.6)

## 2018-06-16 DIAGNOSIS — I10 Essential (primary) hypertension: Secondary | ICD-10-CM | POA: Insufficient documentation

## 2018-06-16 NOTE — Progress Notes (Signed)
Office: [phone]  /  Fax: [phone]   HPI:   Chief Complaint: OBESITY Sylvia Hopkins is here to discuss her progress with her obesity treatment plan. She is on the Category 2 plan and is following her eating plan approximately 50 % of the time. She states she is exercising 0 minutes 0 times per week. Sylvia Hopkins is doing well with the Category 2 plan, but she is getting bored with the plan. Her weight is 194 lb (88 kg) today and has had a weight loss of 5 pounds over a period of 4 weeks since her last visit. She has lost 7 lbs since starting treatment with [place].  Vitamin D deficiency Sylvia Hopkins has a diagnosis of vitamin D deficiency. She is currently taking vit D and her last level was at 26.9. She denies nausea, vomiting or muscle weakness.  Hypertension Sylvia Hopkins is a 62 y.o. female with hypertension. She is currently taking HCTZ. Her blood pressure is at 136/87 today. Sylvia Hopkins denies chest pain or shortness of breath on exertion. She is working weight loss to help control her blood pressure with the goal of decreasing her risk of heart attack and stroke. Sylvia Hopkins blood pressure is currently controlled.  At risk for cardiovascular disease Sylvia Hopkins is at a higher than average risk for cardiovascular disease due to obesity and hypertension. She currently denies any chest pain.  Elevated Glucose Sylvia Hopkins has elevated glucose without hyperglycemia signs or symptoms. She denies polyphagia.  ALLERGIES: Allergies  Allergen Reactions  . Advil [Ibuprofen] Other (See Comments)    Upset stomach     MEDICATIONS: Current Outpatient Medications on File Prior to Visit  Medication Sig Dispense Refill  . albuterol (PROVENTIL HFA;VENTOLIN HFA) 108 (90 BASE) MCG/ACT inhaler Inhale 2 puffs into the lungs every 6 (six) hours as needed for wheezing or shortness of breath.    . ALPRAZolam (XANAX) 0.5 MG tablet Take 0.5 mg by mouth as needed for anxiety.    Marland Kitchen amphetamine-dextroamphetamine (ADDERALL) 30  MG tablet Take 1 tablet by mouth 2 (two) times daily. 60 tablet 0  . azelastine (ASTELIN) 0.1 % nasal spray Place 1 spray into both nostrils 2 (two) times daily. Use in each nostril as directed    . diphenhydrAMINE (BENADRYL ALLERGY) 25 MG tablet Take 25 mg by mouth every 6 (six) hours as needed.    Marland Kitchen esomeprazole (NEXIUM) 20 MG capsule Take 20 mg by mouth daily at 12 noon.    Marland Kitchen ESTRADIOL-NORETHINDRONE ACET TD Place onto the skin. 0.025-0.14 mg place one patch on skin two times wkly    . famotidine (PEPCID AC) 10 MG chewable tablet Chew 10 mg by mouth daily.    . fluticasone (FLONASE) 50 MCG/ACT nasal spray Place 1 spray into both nostrils daily.    . hydrochlorothiazide (HYDRODIURIL) 12.5 MG tablet Take 1 tablet (12.5 mg total) by mouth daily. 30 tablet 0  . levocetirizine (XYZAL) 5 MG tablet Take 5 mg by mouth every evening.    . mometasone-formoterol (DULERA) 100-5 MCG/ACT AERO daily.    . Multiple Vitamins-Minerals (MULTIVITAMIN WOMEN) TABS Take by mouth.    . Probiotic Product (PROBIOTIC ADVANCED PO) Take 1 tablet by mouth at bedtime.    . traMADol-acetaminophen (ULTRACET) 37.5-325 MG tablet Take 1 tablet by mouth every 6 (six) hours as needed.     No current facility-administered medications on file prior to visit.     PAST MEDICAL HISTORY: Past Medical History:  Diagnosis Date  . ADHD   .  Alcohol use   . Anxiety   . Asthma due to environmental allergies   . Back pain   . Constipation   . Depression   . Diverticula of colon   . Diverticulitis   . Fatigue   . GERD (gastroesophageal reflux disease)   . Hay fever   . HTN (hypertension)   . Hypothyroidism   . Joint pain   . Seasonal allergies   . Shortness of breath on exertion   . Swelling of extremity   . Thyroid disease   . Vision changes   . Vitamin D deficiency     PAST SURGICAL HISTORY: Past Surgical History:  Procedure Laterality Date  . CESAREAN SECTION     x2  . TONSILLECTOMY      SOCIAL HISTORY: Social  History   Tobacco Use  . Smoking status: Never Smoker  . Smokeless tobacco: Never Used  Substance Use Topics  . Alcohol use: Yes    Comment: 2-3 drinks per day  . Drug use: No    FAMILY HISTORY: Family History  Problem Relation Age of Onset  . Emphysema Mother   . Depression Mother   . Anxiety disorder Mother   . Alcoholism Mother   . Prostate cancer Father   . Heart disease Father   . Hyperlipidemia Father   . Hypertension Father   . Cancer Father   . Hyperlipidemia Sister   . Heart disease Brother   . Hypertension Brother   . Hepatitis C Child     ROS: Review of Systems  Constitutional: Positive for weight loss.  Respiratory: Negative for shortness of breath (on exertion).   Cardiovascular: Negative for chest pain.  Gastrointestinal: Negative for nausea and vomiting.  Musculoskeletal:       Negative for muscle weakness  Endo/Heme/Allergies:       Negative for polyphagia    PHYSICAL EXAM: Blood pressure 136/87, pulse 86, temperature 97.6 F (36.4 C), temperature source Oral, height 5\' 4"  (1.626 m), weight 194 lb (88 kg), SpO2 97 %. Body mass index is 33.3 kg/m. Physical Exam  Constitutional: She is oriented to person, place, and time. She appears well-developed and well-nourished.  Cardiovascular: Normal rate.  Pulmonary/Chest: Effort normal.  Musculoskeletal: Normal range of motion.  Neurological: She is oriented to person, place, and time.  Skin: Skin is warm and dry.  Psychiatric: She has a normal mood and affect. Her behavior is normal.  Vitals reviewed.   RECENT LABS AND TESTS: BMET    Component Value Date/Time   NA 138 06/11/2018 0843   K 4.1 06/11/2018 0843   CL 97 06/11/2018 0843   CO2 22 06/11/2018 0843   GLUCOSE 111 (H) 06/11/2018 0843   GLUCOSE 113 (H) 01/20/2015 0345   BUN 19 06/11/2018 0843   CREATININE 0.76 06/11/2018 0843   CALCIUM 9.7 06/11/2018 0843   GFRNONAA 83 06/11/2018 0843   GFRAA 96 06/11/2018 0843   Lab Results    Component Value Date   HGBA1C 5.3 06/11/2018   HGBA1C 5.5 04/03/2018   Lab Results  Component Value Date   INSULIN 9.8 06/11/2018   INSULIN 8.2 04/03/2018   CBC    Component Value Date/Time   WBC 5.0 01/20/2015 0345   RBC 3.79 (L) 01/20/2015 0345   HGB 12.0 01/20/2015 0345   HCT 35.5 (L) 01/20/2015 0345   PLT 254 01/20/2015 0345   MCV 93.7 01/20/2015 0345   MCH 31.7 01/20/2015 0345   MCHC 33.8 01/20/2015 0345   RDW  13.5 01/20/2015 0345   Iron/TIBC/Ferritin/ %Sat    Component Value Date/Time   IRON 91 11/26/2016 1007   FERRITIN 39 11/26/2016 1007   Lipid Panel     Component Value Date/Time   CHOL 180 06/11/2018 0843   TRIG 222 (H) 06/11/2018 0843   HDL 38 (L) 06/11/2018 0843   LDLCALC 98 06/11/2018 0843   Hepatic Function Panel     Component Value Date/Time   PROT 6.6 06/11/2018 0843   ALBUMIN 4.8 06/11/2018 0843   AST 15 06/11/2018 0843   ALT 21 06/11/2018 0843   ALKPHOS 53 06/11/2018 0843   BILITOT 0.3 06/11/2018 0843   BILIDIR 0.1 01/17/2015 0032   IBILI 0.8 01/17/2015 0032      Component Value Date/Time   TSH 3.380 04/03/2018 1111   TSH 0.893 01/17/2015 0839    Ref. Range 04/03/2018 11:11  Vitamin D, 25-Hydroxy Latest Ref Range: 30.0 - 100.0 ng/mL 26.9 (L)   ASSESSMENT AND PLAN: Vitamin D deficiency - Plan: VITAMIN D 25 Hydroxy (Vit-D Deficiency, Fractures), Vitamin D, Ergocalciferol, (DRISDOL) 1.25 MG (50000 UT) CAPS capsule  Essential hypertension - Plan: Comprehensive metabolic panel, Lipid Panel With LDL/HDL Ratio  Elevated glucose - Plan: Insulin, random  At risk for heart disease  Class 1 obesity with serious comorbidity and body mass index (BMI) of 33.0 to 33.9 in adult, unspecified obesity type  PLAN:  Vitamin D Deficiency Sylvia Hopkins was informed that low vitamin D levels contributes to fatigue and are associated with obesity, breast, and colon cancer. She agrees to continue to take prescription Vit D @50 ,000 IU every week #4 with no  refills and will follow up for routine testing of vitamin D, at least 2-3 times per year. She was informed of the risk of over-replacement of vitamin D and agrees to not increase her dose unless she discusses this with [place] first.  Hypertension We discussed sodium restriction, working on healthy weight loss, and a regular exercise program as the means to achieve improved blood pressure control. Sylvia Hopkins agreed with this plan and agreed to follow up as directed. We will continue to monitor her blood pressure as well as her progress with the above lifestyle modifications. She will continue her medications as prescribed and will watch for signs of hypotension as she continues her lifestyle modifications.  Cardiovascular risk counseling Sylvia Hopkins was given extended (15 minutes) coronary artery disease prevention counseling today. She is 62 y.o. female and has risk factors for heart disease including obesity and hypertension. We discussed intensive lifestyle modifications today with an emphasis on specific weight loss instructions and strategies. Pt was also informed of the importance of increasing exercise and decreasing saturated fats to help prevent heart disease.  Elevated Glucose Fasting labs will be obtained and results with be discussed with Sylvia Hopkins in 2 weeks at her follow up visit. In the meanwhile Sylvia Hopkins was started on a lower simple carbohydrate diet and will work on weight loss efforts.  Obesity Sylvia Hopkins is currently in the action stage of change. As such, her goal is to continue with weight loss efforts She has agreed to start keeping a food journal with 400 to 500 calories and 35+ grams of protein at supper daily and follow the Category 2 plan Sylvia Hopkins has been instructed to work up to a goal of 150 minutes of combined cardio and strengthening exercise per week for weight loss and overall health benefits. We discussed the following Behavioral Modification Strategies today: increase H2O intake, keeping  healthy foods in the  home, better snacking choices, increasing lean protein intake, decreasing simple carbohydrates , increasing vegetables and work on meal planning and easy cooking plans  Handouts were given to patient today  Sylvia Hopkins has agreed to follow up with our clinic in 2 weeks. She was informed of the importance of frequent follow up visits to maximize her success with intensive lifestyle modifications for her multiple health conditions.   OBESITY BEHAVIORAL INTERVENTION VISIT  Today's visit was # 4   Starting weight: 201 lbs Starting date: 04/03/2018 Today's weight : 194 lbs Today's date: 06/11/2018 Total lbs lost to date: 7   ASK: We discussed the diagnosis of obesity with Sylvia Hopkins today and Beaux agreed to give [place] permission to discuss obesity behavioral modification therapy today.  ASSESS: Sylvia Hopkins has the diagnosis of obesity and her BMI today is 33.28 Sylvia Hopkins is in the action stage of change   ADVISE: Sylvia Hopkins was educated on the multiple health risks of obesity as well as the benefit of weight loss to improve her health. She was advised of the need for long term treatment and the importance of lifestyle modifications to improve her current health and to decrease her risk of future health problems.  AGREE: Multiple dietary modification options and treatment options were discussed and  Sylvia Hopkins agreed to follow the recommendations documented in the above note.  ARRANGE: Sylvia Hopkins was educated on the importance of frequent visits to treat obesity as outlined per CMS and USPSTF guidelines and agreed to schedule her next follow up appointment today.  Corey Skains, am acting as Location manager for [employer]. Owens Shark, DO  I have reviewed the above documentation for accuracy and completeness, and I agree with the above. -Jearld Lesch, DO

## 2018-06-20 DIAGNOSIS — R9389 Abnormal findings on diagnostic imaging of other specified body structures: Secondary | ICD-10-CM | POA: Diagnosis not present

## 2018-06-25 ENCOUNTER — Ambulatory Visit (INDEPENDENT_AMBULATORY_CARE_PROVIDER_SITE_OTHER): Payer: 59 | Admitting: Bariatrics

## 2018-06-25 ENCOUNTER — Encounter (INDEPENDENT_AMBULATORY_CARE_PROVIDER_SITE_OTHER): Payer: Self-pay | Admitting: Bariatrics

## 2018-06-25 VITALS — BP 146/83 | HR 87 | Temp 98.1°F | Ht 64.0 in | Wt 193.0 lb

## 2018-06-25 DIAGNOSIS — E559 Vitamin D deficiency, unspecified: Secondary | ICD-10-CM | POA: Diagnosis not present

## 2018-06-25 DIAGNOSIS — I10 Essential (primary) hypertension: Secondary | ICD-10-CM | POA: Diagnosis not present

## 2018-06-25 DIAGNOSIS — Z9189 Other specified personal risk factors, not elsewhere classified: Secondary | ICD-10-CM

## 2018-06-25 DIAGNOSIS — E039 Hypothyroidism, unspecified: Secondary | ICD-10-CM | POA: Diagnosis not present

## 2018-06-25 DIAGNOSIS — Z6833 Body mass index (BMI) 33.0-33.9, adult: Secondary | ICD-10-CM

## 2018-06-25 DIAGNOSIS — E669 Obesity, unspecified: Secondary | ICD-10-CM

## 2018-06-25 MED ORDER — VITAMIN D (ERGOCALCIFEROL) 1.25 MG (50000 UNIT) PO CAPS: 50000.0000 [IU] | ORAL_CAPSULE | ORAL | 0 refills | Status: DC

## 2018-06-25 MED ORDER — HYDROCHLOROTHIAZIDE 12.5 MG PO TABS: 12.5000 mg | ORAL_TABLET | Freq: Every day | ORAL | 0 refills | Status: DC

## 2018-06-25 NOTE — Progress Notes (Signed)
Office: [phone]  /  Fax: [phone]   HPI:   Chief Complaint: OBESITY Sylvia Hopkins is here to discuss her progress with her obesity treatment plan. She is on the keep a food journal with 400 to 500 calories and 35+ grams of protein at supper daily and the Category 2 plan and is following her eating plan approximately 80 % of the time. She states she is exercising 0 minutes 0 times per week. Sylvia Hopkins id doing well with the plan. She has struggled some with the holidays. Her weight is 193 lb (87.5 kg) today and has had a weight loss of 1 pound over a period of 2 weeks since her last visit. She has lost 8 lbs since starting treatment with [place].  Hypertension Sylvia Hopkins is a 62 y.o. female with hypertension. Sylvia Hopkins denies headaches or shortness of breath on exertion. She is working weight loss to help control her blood pressure with the goal of decreasing her risk of heart attack and stroke. Sylvia Hopkins blood pressure is well controlled.  At risk for cardiovascular disease Sylvia Hopkins is at a higher than average risk for cardiovascular disease due to obesity and hypertension. She currently denies any chest pain.  Hypothyroidism Sylvia Hopkins has a diagnosis of hypothyroidism and she is currently well controlled. She is not on levothyroxine. She denies hot or cold intolerance.   Vitamin D deficiency Sylvia Hopkins has a diagnosis of vitamin D deficiency. She is currently taking vit D and denies nausea, vomiting or muscle weakness.  ASSESSMENT AND PLAN:  Essential hypertension - Plan: hydrochlorothiazide (HYDRODIURIL) 12.5 MG tablet  Vitamin D deficiency - Plan: Vitamin D, Ergocalciferol, (DRISDOL) 1.25 MG (50000 UT) CAPS capsule  Hypothyroidism, unspecified type  At risk for heart disease  Class 1 obesity with serious comorbidity and body mass index (BMI) of 33.0 to 33.9 in adult, unspecified obesity type - Plan: Vitamin D, Ergocalciferol, (DRISDOL) 1.25 MG (50000 UT) CAPS  capsule  PLAN:  Hypertension We discussed sodium restriction, working on healthy weight loss, and a regular exercise program as the means to achieve improved blood pressure control. Sylvia Hopkins agreed with this plan and agreed to follow up as directed. We will continue to monitor her blood pressure as well as her progress with the above lifestyle modifications. She agreed to continue HCTZ 12.5 mg #30 with no refills and will watch for signs of hypotension as she continues her lifestyle modifications.  Cardiovascular risk counseling Sylvia Hopkins was given extended (15 minutes) coronary artery disease prevention counseling today. She is 62 y.o. female and has risk factors for heart disease including obesity and hypertension. We discussed intensive lifestyle modifications today with an emphasis on specific weight loss instructions and strategies. Pt was also informed of the importance of increasing exercise and decreasing saturated fats to help prevent heart disease.  Hypothyroidism Sylvia Hopkins was informed of the importance of good thyroid control to help with weight loss efforts. She was also informed that supertheraputic thyroid levels are dangerous and will not improve weight loss results. Sylvia Hopkins will follow up with her PCP.  Vitamin D Deficiency Sylvia Hopkins was informed that low vitamin D levels contributes to fatigue and are associated with obesity, breast, and colon cancer. She agrees to continue to take prescription Vit D @50 ,000 IU every week #4 with no refills and will follow up for routine testing of vitamin D, at least 2-3 times per year. She was informed of the risk of over-replacement of vitamin D and agrees to not increase her dose unless she  discusses this with [place] first.  Obesity Sylvia Hopkins is currently in the action stage of change. As such, her goal is to continue with weight loss efforts She has agreed to keep a food journal with 400 to 500 calories and 35+ grams of protein at supper daily and follow the  Category 2 plan Sylvia Hopkins has been instructed to work up to a goal of 150 minutes of combined cardio and strengthening exercise per week for weight loss and overall health benefits. We discussed the following Behavioral Modification Strategies today: increase H2O intake, no skipping meals, increasing lean protein intake, decreasing simple carbohydrates , increasing vegetables and work on meal planning and easy cooking plans Handouts "Strategies for the holidays", holiday favorites were provided to patient today.Sylvia Hopkins has agreed to follow up with our clinic in 2 weeks. She was informed of the importance of frequent follow up visits to maximize her success with intensive lifestyle modifications for her multiple health conditions.  ALLERGIES: Allergies  Allergen Reactions  . Advil [Ibuprofen] Other (See Comments)    Upset stomach     MEDICATIONS: Current Outpatient Medications on File Prior to Visit  Medication Sig Dispense Refill  . albuterol (PROVENTIL HFA;VENTOLIN HFA) 108 (90 BASE) MCG/ACT inhaler Inhale 2 puffs into the lungs every 6 (six) hours as needed for wheezing or shortness of breath.    . ALPRAZolam (XANAX) 0.5 MG tablet Take 0.5 mg by mouth as needed for anxiety.    Marland Kitchen amphetamine-dextroamphetamine (ADDERALL) 30 MG tablet Take 1 tablet by mouth 2 (two) times daily. 60 tablet 0  . azelastine (ASTELIN) 0.1 % nasal spray Place 1 spray into both nostrils 2 (two) times daily. Use in each nostril as directed    . diphenhydrAMINE (BENADRYL ALLERGY) 25 MG tablet Take 25 mg by mouth every 6 (six) hours as needed.    Marland Kitchen esomeprazole (NEXIUM) 20 MG capsule Take 20 mg by mouth daily at 12 noon.    Marland Kitchen ESTRADIOL-NORETHINDRONE ACET TD Place onto the skin. 0.025-0.14 mg place one patch on skin two times wkly    . famotidine (PEPCID AC) 10 MG chewable tablet Chew 10 mg by mouth daily.    . fluticasone (FLONASE) 50 MCG/ACT nasal spray Place 1 spray into both nostrils daily.    . hydrochlorothiazide  (HYDRODIURIL) 12.5 MG tablet Take 1 tablet (12.5 mg total) by mouth daily. 30 tablet 0  . levocetirizine (XYZAL) 5 MG tablet Take 5 mg by mouth every evening.    . mometasone-formoterol (DULERA) 100-5 MCG/ACT AERO daily.    . Multiple Vitamins-Minerals (MULTIVITAMIN WOMEN) TABS Take by mouth.    . Probiotic Product (PROBIOTIC ADVANCED PO) Take 1 tablet by mouth at bedtime.    . traMADol-acetaminophen (ULTRACET) 37.5-325 MG tablet Take 1 tablet by mouth every 6 (six) hours as needed.    . Vitamin D, Ergocalciferol, (DRISDOL) 1.25 MG (50000 UT) CAPS capsule Take 1 capsule (50,000 Units total) by mouth every 7 (seven) days. 4 capsule 0   No current facility-administered medications on file prior to visit.     PAST MEDICAL HISTORY: Past Medical History:  Diagnosis Date  . ADHD   . Alcohol use   . Anxiety   . Asthma due to environmental allergies   . Back pain   . Constipation   . Depression   . Diverticula of colon   . Diverticulitis   . Fatigue   . GERD (gastroesophageal reflux disease)   . Hay fever   . HTN (hypertension)   .  Hypothyroidism   . Joint pain   . Seasonal allergies   . Shortness of breath on exertion   . Swelling of extremity   . Thyroid disease   . Vision changes   . Vitamin D deficiency     PAST SURGICAL HISTORY: Past Surgical History:  Procedure Laterality Date  . CESAREAN SECTION     x2  . TONSILLECTOMY      SOCIAL HISTORY: Social History   Tobacco Use  . Smoking status: Never Smoker  . Smokeless tobacco: Never Used  Substance Use Topics  . Alcohol use: Yes    Comment: 2-3 drinks per day  . Drug use: No    FAMILY HISTORY: Family History  Problem Relation Age of Onset  . Emphysema Mother   . Depression Mother   . Anxiety disorder Mother   . Alcoholism Mother   . Prostate cancer Father   . Heart disease Father   . Hyperlipidemia Father   . Hypertension Father   . Cancer Father   . Hyperlipidemia Sister   . Heart disease Brother   .  Hypertension Brother   . Hepatitis C Child     ROS: Review of Systems  Constitutional: Positive for weight loss.  Respiratory: Negative for shortness of breath (on exertion).   Cardiovascular: Negative for chest pain.  Gastrointestinal: Negative for nausea and vomiting.  Musculoskeletal:       Negative for muscle weakness  Neurological: Negative for headaches.  Endo/Heme/Allergies:       Negative for hot or cold intolerance    PHYSICAL EXAM: Blood pressure (!) 146/83, pulse 87, temperature 98.1 F (36.7 C), temperature source Oral, height 5\' 4"  (1.626 m), weight 193 lb (87.5 kg), SpO2 98 %. Body mass index is 33.13 kg/m. Physical Exam Vitals signs reviewed.  Constitutional:      Appearance: Normal appearance. She is well-developed. She is obese.  Cardiovascular:     Rate and Rhythm: Normal rate.  Pulmonary:     Effort: Pulmonary effort is normal.  Musculoskeletal: Normal range of motion.  Skin:    General: Skin is warm and dry.  Neurological:     Mental Status: She is alert and oriented to person, place, and time.  Psychiatric:        Mood and Affect: Mood normal.        Behavior: Behavior normal.     RECENT LABS AND TESTS: BMET    Component Value Date/Time   NA 138 06/11/2018 0843   K 4.1 06/11/2018 0843   CL 97 06/11/2018 0843   CO2 22 06/11/2018 0843   GLUCOSE 111 (H) 06/11/2018 0843   GLUCOSE 113 (H) 01/20/2015 0345   BUN 19 06/11/2018 0843   CREATININE 0.76 06/11/2018 0843   CALCIUM 9.7 06/11/2018 0843   GFRNONAA 83 06/11/2018 0843   GFRAA 96 06/11/2018 0843   Lab Results  Component Value Date   HGBA1C 5.3 06/11/2018   HGBA1C 5.5 04/03/2018   Lab Results  Component Value Date   INSULIN 9.8 06/11/2018   INSULIN 8.2 04/03/2018   CBC    Component Value Date/Time   WBC 5.0 01/20/2015 0345   RBC 3.79 (L) 01/20/2015 0345   HGB 12.0 01/20/2015 0345   HCT 35.5 (L) 01/20/2015 0345   PLT 254 01/20/2015 0345   MCV 93.7 01/20/2015 0345   MCH 31.7  01/20/2015 0345   MCHC 33.8 01/20/2015 0345   RDW 13.5 01/20/2015 0345   Iron/TIBC/Ferritin/ %Sat    Component Value Date/Time  IRON 91 11/26/2016 1007   FERRITIN 39 11/26/2016 1007   Lipid Panel     Component Value Date/Time   CHOL 180 06/11/2018 0843   TRIG 222 (H) 06/11/2018 0843   HDL 38 (L) 06/11/2018 0843   LDLCALC 98 06/11/2018 0843   Hepatic Function Panel     Component Value Date/Time   PROT 6.6 06/11/2018 0843   ALBUMIN 4.8 06/11/2018 0843   AST 15 06/11/2018 0843   ALT 21 06/11/2018 0843   ALKPHOS 53 06/11/2018 0843   BILITOT 0.3 06/11/2018 0843   BILIDIR 0.1 01/17/2015 0032   IBILI 0.8 01/17/2015 0032      Component Value Date/Time   TSH 3.380 04/03/2018 1111   TSH 0.893 01/17/2015 0839    Ref. Range 06/11/2018 08:43  Vitamin D, 25-Hydroxy Latest Ref Range: 30.0 - 100.0 ng/mL 39.0     OBESITY BEHAVIORAL INTERVENTION VISIT  Today's visit was # 5   Starting weight: 201 lbs Starting date: 04/03/2018 Today's weight : 193 lbs  Today's date: 06/25/2018 Total lbs lost to date: 8   ASK: We discussed the diagnosis of obesity with Sylvia Hopkins today and Sylvia Hopkins agreed to give [place] permission to discuss obesity behavioral modification therapy today.  ASSESS: Yadira has the diagnosis of obesity and her BMI today is 33.11 Sylvia Hopkins is in the action stage of change   ADVISE: Nykeria was educated on the multiple health risks of obesity as well as the benefit of weight loss to improve her health. She was advised of the need for long term treatment and the importance of lifestyle modifications to improve her current health and to decrease her risk of future health problems.  AGREE: Multiple dietary modification options and treatment options were discussed and  Cambell agreed to follow the recommendations documented in the above note.  ARRANGE: Brittane was educated on the importance of frequent visits to treat obesity as outlined per CMS and USPSTF guidelines and  agreed to schedule her next follow up appointment today.  Corey Skains, am acting as Location manager for [employer]. Owens Shark, DO  I have reviewed the above documentation for accuracy and completeness, and I agree with the above. -Sylvia Lesch, DO

## 2018-07-15 ENCOUNTER — Ambulatory Visit (INDEPENDENT_AMBULATORY_CARE_PROVIDER_SITE_OTHER): Payer: 59 | Admitting: Bariatrics

## 2018-07-15 ENCOUNTER — Encounter (INDEPENDENT_AMBULATORY_CARE_PROVIDER_SITE_OTHER): Payer: Self-pay | Admitting: Bariatrics

## 2018-07-15 VITALS — BP 149/94 | HR 92 | Temp 97.5°F | Ht 64.0 in | Wt 197.0 lb

## 2018-07-15 DIAGNOSIS — I10 Essential (primary) hypertension: Secondary | ICD-10-CM

## 2018-07-15 DIAGNOSIS — Z9189 Other specified personal risk factors, not elsewhere classified: Secondary | ICD-10-CM

## 2018-07-15 DIAGNOSIS — E669 Obesity, unspecified: Secondary | ICD-10-CM | POA: Diagnosis not present

## 2018-07-15 DIAGNOSIS — Z6833 Body mass index (BMI) 33.0-33.9, adult: Secondary | ICD-10-CM

## 2018-07-15 DIAGNOSIS — E559 Vitamin D deficiency, unspecified: Secondary | ICD-10-CM

## 2018-07-15 MED ORDER — HYDROCHLOROTHIAZIDE 12.5 MG PO TABS: 12.5000 mg | ORAL_TABLET | Freq: Every day | ORAL | 0 refills | Status: DC

## 2018-07-15 MED ORDER — VITAMIN D (ERGOCALCIFEROL) 1.25 MG (50000 UNIT) PO CAPS: 50000.0000 [IU] | ORAL_CAPSULE | ORAL | 0 refills | Status: DC

## 2018-07-15 NOTE — Progress Notes (Signed)
Office: [phone]  /  Fax: [phone]   HPI:   Chief Complaint: OBESITY Sylvia Hopkins is here to discuss her progress with her obesity treatment plan. She is on the keep a food journal with 400 to 500 calories and 35+ grams of protein at supper daily and follow the Category 2 plan and is following her eating plan approximately 60 % of the time. She states she is exercising 0 minutes 0 times per week. Sylvia Hopkins has struggled over the last few weeks. She is using a "gym fitness" watch. Her weight is 197 lb (89.4 kg) today and has had a weight gain of 4 pounds over a period of 3 weeks since her last visit. She has lost 4 lbs since starting treatment with [place].  Hypertension Sylvia Hopkins is a 63 y.o. female with hypertension. She did not take her medications today (no medications and rushing today). She is currently taking HCTZ. Grafton Folk denies chest pain or shortness of breath on exertion. She is working weight loss to help control her blood pressure with the goal of decreasing her risk of heart attack and stroke. Marthas blood pressure is not well controlled.  Vitamin D deficiency Sylvia Hopkins has a diagnosis of vitamin D deficiency. She is currently taking high dose vit D and denies nausea, vomiting or muscle weakness.  At risk for osteopenia and osteoporosis Sylvia Hopkins is at higher risk of osteopenia and osteoporosis due to vitamin D deficiency.   ASSESSMENT AND PLAN:  Essential hypertension - Plan: hydrochlorothiazide (HYDRODIURIL) 12.5 MG tablet  Vitamin D deficiency - Plan: Vitamin D, Ergocalciferol, (DRISDOL) 1.25 MG (50000 UT) CAPS capsule  At risk for osteoporosis  Class 1 obesity with serious comorbidity and body mass index (BMI) of 33.0 to 33.9 in adult, unspecified obesity type - Plan: Vitamin D, Ergocalciferol, (DRISDOL) 1.25 MG (50000 UT) CAPS capsule  PLAN:  Hypertension We discussed sodium restriction, working on healthy weight loss, and a regular exercise program as the  means to achieve improved blood pressure control. [name] agreed with this plan and agreed to follow up as directed. We will continue to monitor her blood pressure as well as her progress with the above lifestyle modifications. She agrees to continue HCTZ 12.5 mg daily #30 with no refills and will watch for signs of hypotension as she continues her lifestyle modifications.  Vitamin D Deficiency Sylvia Hopkins was informed that low vitamin D levels contributes to fatigue and are associated with obesity, breast, and colon cancer. She agrees to continue to take prescription Vit D @50 ,000 IU every week #4 with no refills and will follow up for routine testing of vitamin D, at least 2-3 times per year. She was informed of the risk of over-replacement of vitamin D and agrees to not increase her dose unless she discusses this with [place] first. Sylvia Hopkins agrees to follow up as directed.  At risk for osteopenia and osteoporosis Sylvia Hopkins was given extended  (15 minutes) osteoporosis prevention counseling today. Tiphanie is at risk for osteopenia and osteoporosis due to her vitamin D deficiency. She was encouraged to take her vitamin D and follow her higher calcium diet and increase strengthening exercise to help strengthen her bones and decrease her risk of osteopenia and osteoporosis.  Obesity [name] is currently in the action stage of change. As such, her goal is to continue with weight loss efforts She has agreed to keep a food journal with 400 to 500 calories and 35+ grams of protein daily and follow the Category 2 plan  Sylvia Hopkins has been instructed to work up to a goal of 150 minutes of combined cardio and strengthening exercise per week for weight loss and overall health benefits. We discussed the following Behavioral Modification Strategies today: increase H2O intake, keeping healthy foods in the home, continue increasing lean protein and healthy fats, decreasing simple carbohydrates , increasing vegetables, work on meal  planning and easy cooking plans and decrease liquid calories  Sylvia Hopkins has agreed to follow up with our clinic in 2 weeks. She was informed of the importance of frequent follow up visits to maximize her success with intensive lifestyle modifications for her multiple health conditions.  ALLERGIES: Allergies  Allergen Reactions  . Advil [Ibuprofen] Other (See Comments)    Upset stomach     MEDICATIONS: Current Outpatient Medications on File Prior to Visit  Medication Sig Dispense Refill  . albuterol (PROVENTIL HFA;VENTOLIN HFA) 108 (90 BASE) MCG/ACT inhaler Inhale 2 puffs into the lungs every 6 (six) hours as needed for wheezing or shortness of breath.    . ALPRAZolam (XANAX) 0.5 MG tablet Take 0.5 mg by mouth as needed for anxiety.    Marland Kitchen amphetamine-dextroamphetamine (ADDERALL) 30 MG tablet Take 1 tablet by mouth 2 (two) times daily. 60 tablet 0  . azelastine (ASTELIN) 0.1 % nasal spray Place 1 spray into both nostrils 2 (two) times daily. Use in each nostril as directed    . diphenhydrAMINE (BENADRYL ALLERGY) 25 MG tablet Take 25 mg by mouth every 6 (six) hours as needed.    Marland Kitchen esomeprazole (NEXIUM) 20 MG capsule Take 20 mg by mouth daily at 12 noon.    Marland Kitchen ESTRADIOL-NORETHINDRONE ACET TD Place onto the skin. 0.025-0.14 mg place one patch on skin two times wkly    . famotidine (PEPCID AC) 10 MG chewable tablet Chew 10 mg by mouth daily.    . fluticasone (FLONASE) 50 MCG/ACT nasal spray Place 1 spray into both nostrils daily.    Marland Kitchen levocetirizine (XYZAL) 5 MG tablet Take 5 mg by mouth every evening.    . mometasone-formoterol (DULERA) 100-5 MCG/ACT AERO daily.    . Multiple Vitamins-Minerals (MULTIVITAMIN WOMEN) TABS Take by mouth.    . Probiotic Product (PROBIOTIC ADVANCED PO) Take 1 tablet by mouth at bedtime.    . traMADol-acetaminophen (ULTRACET) 37.5-325 MG tablet Take 1 tablet by mouth every 6 (six) hours as needed.     No current facility-administered medications on file prior to visit.       PAST MEDICAL HISTORY: Past Medical History:  Diagnosis Date  . ADHD   . Alcohol use   . Anxiety   . Asthma due to environmental allergies   . Back pain   . Constipation   . Depression   . Diverticula of colon   . Diverticulitis   . Fatigue   . GERD (gastroesophageal reflux disease)   . Hay fever   . HTN (hypertension)   . Hypothyroidism   . Joint pain   . Seasonal allergies   . Shortness of breath on exertion   . Swelling of extremity   . Thyroid disease   . Vision changes   . Vitamin D deficiency     PAST SURGICAL HISTORY: Past Surgical History:  Procedure Laterality Date  . CESAREAN SECTION     x2  . TONSILLECTOMY      SOCIAL HISTORY: Social History   Tobacco Use  . Smoking status: Never Smoker  . Smokeless tobacco: Never Used  Substance Use Topics  . Alcohol use: Yes  Comment: 2-3 drinks per day  . Drug use: No    FAMILY HISTORY: Family History  Problem Relation Age of Onset  . Emphysema Mother   . Depression Mother   . Anxiety disorder Mother   . Alcoholism Mother   . Prostate cancer Father   . Heart disease Father   . Hyperlipidemia Father   . Hypertension Father   . Cancer Father   . Hyperlipidemia Sister   . Heart disease Brother   . Hypertension Brother   . Hepatitis C Child     ROS: Review of Systems  Constitutional: Negative for weight loss.  Respiratory: Negative for shortness of breath (on exertion).   Cardiovascular: Negative for chest pain.  Gastrointestinal: Negative for nausea and vomiting.  Musculoskeletal:       Negative for muscle weakness    PHYSICAL EXAM: Blood pressure (!) 149/94, pulse 92, temperature (!) 97.5 F (36.4 C), temperature source Oral, height 5\' 4"  (1.626 m), weight 197 lb (89.4 kg), SpO2 98 %. Body mass index is 33.81 kg/m. Physical Exam Vitals signs reviewed.  Constitutional:      Appearance: Normal appearance. She is well-developed. She is obese.  Cardiovascular:     Rate and Rhythm:  Normal rate.  Pulmonary:     Effort: Pulmonary effort is normal.  Musculoskeletal: Normal range of motion.  Skin:    General: Skin is warm and dry.  Neurological:     Mental Status: She is alert and oriented to person, place, and time.  Psychiatric:        Mood and Affect: Mood normal.        Behavior: Behavior normal.     RECENT LABS AND TESTS: BMET    Component Value Date/Time   NA 138 06/11/2018 0843   K 4.1 06/11/2018 0843   CL 97 06/11/2018 0843   CO2 22 06/11/2018 0843   GLUCOSE 111 (H) 06/11/2018 0843   GLUCOSE 113 (H) 01/20/2015 0345   BUN 19 06/11/2018 0843   CREATININE 0.76 06/11/2018 0843   CALCIUM 9.7 06/11/2018 0843   GFRNONAA 83 06/11/2018 0843   GFRAA 96 06/11/2018 0843   Lab Results  Component Value Date   HGBA1C 5.3 06/11/2018   HGBA1C 5.5 04/03/2018   Lab Results  Component Value Date   INSULIN 9.8 06/11/2018   INSULIN 8.2 04/03/2018   CBC    Component Value Date/Time   WBC 5.0 01/20/2015 0345   RBC 3.79 (L) 01/20/2015 0345   HGB 12.0 01/20/2015 0345   HCT 35.5 (L) 01/20/2015 0345   PLT 254 01/20/2015 0345   MCV 93.7 01/20/2015 0345   MCH 31.7 01/20/2015 0345   MCHC 33.8 01/20/2015 0345   RDW 13.5 01/20/2015 0345   Iron/TIBC/Ferritin/ %Sat    Component Value Date/Time   IRON 91 11/26/2016 1007   FERRITIN 39 11/26/2016 1007   Lipid Panel     Component Value Date/Time   CHOL 180 06/11/2018 0843   TRIG 222 (H) 06/11/2018 0843   HDL 38 (L) 06/11/2018 0843   LDLCALC 98 06/11/2018 0843   Hepatic Function Panel     Component Value Date/Time   PROT 6.6 06/11/2018 0843   ALBUMIN 4.8 06/11/2018 0843   AST 15 06/11/2018 0843   ALT 21 06/11/2018 0843   ALKPHOS 53 06/11/2018 0843   BILITOT 0.3 06/11/2018 0843   BILIDIR 0.1 01/17/2015 0032   IBILI 0.8 01/17/2015 0032      Component Value Date/Time   TSH 3.380 04/03/2018 1111  TSH 0.893 01/17/2015 0839   Results for KLOIE, WHITING (MRN [id number]) as of 07/15/2018 15:24  Ref.  Range 06/11/2018 08:43  Vitamin D, 25-Hydroxy Latest Ref Range: 30.0 - 100.0 ng/mL 39.0     OBESITY BEHAVIORAL INTERVENTION VISIT  Today's visit was # 6   Starting weight: 201 lbs Starting date: 04/03/2018 Today's weight : 197 lbs Today's date: 07/15/2018 Total lbs lost to date: 4   ASK: We discussed the diagnosis of obesity with Grafton Folk today and Kenzli agreed to give [place] permission to discuss obesity behavioral modification therapy today.  ASSESS: Blanch has the diagnosis of obesity and her BMI today is 33.8 Dacey is in the action stage of change   ADVISE: Shereena was educated on the multiple health risks of obesity as well as the benefit of weight loss to improve her health. She was advised of the need for long term treatment and the importance of lifestyle modifications to improve her current health and to decrease her risk of future health problems.  AGREE: Multiple dietary modification options and treatment options were discussed and  Magdala agreed to follow the recommendations documented in the above note.  ARRANGE: Zemira was educated on the importance of frequent visits to treat obesity as outlined per CMS and USPSTF guidelines and agreed to schedule her next follow up appointment today.  Corey Skains, am acting as Location manager for [employer]. Owens Shark, DO  I have reviewed the above documentation for accuracy and completeness, and I agree with the above. -Jearld Lesch, DO

## 2018-07-30 ENCOUNTER — Encounter (INDEPENDENT_AMBULATORY_CARE_PROVIDER_SITE_OTHER): Payer: Self-pay | Admitting: Bariatrics

## 2018-07-30 ENCOUNTER — Ambulatory Visit (INDEPENDENT_AMBULATORY_CARE_PROVIDER_SITE_OTHER): Payer: 59 | Admitting: Bariatrics

## 2018-07-30 VITALS — BP 141/88 | HR 100 | Temp 98.4°F | Ht 64.0 in | Wt 194.0 lb

## 2018-07-30 DIAGNOSIS — I10 Essential (primary) hypertension: Secondary | ICD-10-CM

## 2018-07-30 DIAGNOSIS — E559 Vitamin D deficiency, unspecified: Secondary | ICD-10-CM

## 2018-07-30 DIAGNOSIS — Z6833 Body mass index (BMI) 33.0-33.9, adult: Secondary | ICD-10-CM | POA: Diagnosis not present

## 2018-07-30 DIAGNOSIS — E669 Obesity, unspecified: Secondary | ICD-10-CM | POA: Diagnosis not present

## 2018-07-31 NOTE — Progress Notes (Signed)
Office: [phone]  /  Fax: [phone]   HPI:   Chief Complaint: OBESITY Sylvia Hopkins is here to discuss her progress with her obesity treatment plan. She is on the keep a food journal with 400 to 500 calories and 35+ grams of protein daily at supper and the Category 2 plan and is following her eating plan approximately 80 % of the time. She states she is exercising 0 minutes 0 times per week. Chen is doing well overall. She has been craving nuts. Her weight is 193 lb (87.5 kg) today and has had a weight loss of 3 pounds over a period of 2 weeks since her last visit. She has lost 7 lbs since starting treatment with [place].  Vitamin D deficiency Sylvia Hopkins has a diagnosis of vitamin D deficiency. She is currently taking high dose vit D and denies nausea, vomiting or muscle weakness.  Hypertension Sylvia Hopkins is a 63 y.o. female with hypertension. She is taking HCTZ without any problems. Sylvia Hopkins denies chest pain or headaches. She is working weight loss to help control her blood pressure with the goal of decreasing her risk of heart attack and stroke. Sylvia Hopkins blood pressure is currently controlled.  ASSESSMENT AND PLAN:  Vitamin D deficiency  Essential hypertension  Class 1 obesity with serious comorbidity and body mass index (BMI) of 33.0 to 33.9 in adult, unspecified obesity type  PLAN:  Vitamin D Deficiency Sylvia Hopkins was informed that low vitamin D levels contributes to fatigue and are associated with obesity, breast, and colon cancer. She agrees to continue to take prescription Vit D @50 ,000 IU every week and will follow up for routine testing of vitamin D, at least 2-3 times per year. She was informed of the risk of over-replacement of vitamin D and agrees to not increase her dose unless she discusses this with [place] first.  Hypertension We discussed sodium restriction, working on healthy weight loss, and a regular exercise program as the means to achieve improved blood pressure  control. Sylvia Hopkins agreed with this plan and agreed to follow up as directed. We will continue to monitor her blood pressure as well as her progress with the above lifestyle modifications. She will continue her medications as prescribed and will watch for signs of hypotension as she continues her lifestyle modifications.  I spent > than 50% of the 15 minute visit on counseling as documented in the note.  Obesity Sylvia Hopkins is currently in the action stage of change. As such, her goal is to continue with weight loss efforts She has agreed to keep a food journal with 400 to 500 calories and 35 grams of protein and follow the Category 2 plan Sylvia Hopkins will do walking at the [facility] a couple of days a week for weight loss and overall health benefits. We discussed the following Behavioral Modification Strategies today: increase H2O intake  increasing lean protein intake, decreasing simple carbohydrates, increasing vegetables, decreasing sodium intake, portion control (nuts) and work on meal planning   Sylvia Hopkins has agreed to follow up with our clinic in 2 weeks. She was informed of the importance of frequent follow up visits to maximize her success with intensive lifestyle modifications for her multiple health conditions.  ALLERGIES: Allergies  Allergen Reactions  . Advil [Ibuprofen] Other (See Comments)    Upset stomach     MEDICATIONS: Current Outpatient Medications on File Prior to Visit  Medication Sig Dispense Refill  . albuterol (PROVENTIL HFA;VENTOLIN HFA) 108 (90 BASE) MCG/ACT inhaler Inhale 2 puffs into  the lungs every 6 (six) hours as needed for wheezing or shortness of breath.    . ALPRAZolam (XANAX) 0.5 MG tablet Take 0.5 mg by mouth as needed for anxiety.    Marland Kitchen amphetamine-dextroamphetamine (ADDERALL) 30 MG tablet Take 1 tablet by mouth 2 (two) times daily. 60 tablet 0  . azelastine (ASTELIN) 0.1 % nasal spray Place 1 spray into both nostrils 2 (two) times daily. Use in each nostril as directed      . diphenhydrAMINE (BENADRYL ALLERGY) 25 MG tablet Take 25 mg by mouth every 6 (six) hours as needed.    Marland Kitchen esomeprazole (NEXIUM) 20 MG capsule Take 20 mg by mouth daily at 12 noon.    Marland Kitchen ESTRADIOL-NORETHINDRONE ACET TD Place onto the skin. 0.025-0.14 mg place one patch on skin two times wkly    . famotidine (PEPCID AC) 10 MG chewable tablet Chew 10 mg by mouth daily.    . fluticasone (FLONASE) 50 MCG/ACT nasal spray Place 1 spray into both nostrils daily.    . hydrochlorothiazide (HYDRODIURIL) 12.5 MG tablet Take 1 tablet (12.5 mg total) by mouth daily. 30 tablet 0  . levocetirizine (XYZAL) 5 MG tablet Take 5 mg by mouth every evening.    . mometasone-formoterol (DULERA) 100-5 MCG/ACT AERO daily.    . Multiple Vitamins-Minerals (MULTIVITAMIN WOMEN) TABS Take by mouth.    . Probiotic Product (PROBIOTIC ADVANCED PO) Take 1 tablet by mouth at bedtime.    . traMADol-acetaminophen (ULTRACET) 37.5-325 MG tablet Take 1 tablet by mouth every 6 (six) hours as needed.    . Vitamin D, Ergocalciferol, (DRISDOL) 1.25 MG (50000 UT) CAPS capsule Take 1 capsule (50,000 Units total) by mouth every 7 (seven) days. 4 capsule 0   No current facility-administered medications on file prior to visit.     PAST MEDICAL HISTORY: Past Medical History:  Diagnosis Date  . ADHD   . Alcohol use   . Anxiety   . Asthma due to environmental allergies   . Back pain   . Constipation   . Depression   . Diverticula of colon   . Diverticulitis   . Fatigue   . GERD (gastroesophageal reflux disease)   . Hay fever   . HTN (hypertension)   . Hypothyroidism   . Joint pain   . Seasonal allergies   . Shortness of breath on exertion   . Swelling of extremity   . Thyroid disease   . Vision changes   . Vitamin D deficiency     PAST SURGICAL HISTORY: Past Surgical History:  Procedure Laterality Date  . CESAREAN SECTION     x2  . TONSILLECTOMY      SOCIAL HISTORY: Social History   Tobacco Use  . Smoking status:  Never Smoker  . Smokeless tobacco: Never Used  Substance Use Topics  . Alcohol use: Yes    Comment: 2-3 drinks per day  . Drug use: No    FAMILY HISTORY: Family History  Problem Relation Age of Onset  . Emphysema Mother   . Depression Mother   . Anxiety disorder Mother   . Alcoholism Mother   . Prostate cancer Father   . Heart disease Father   . Hyperlipidemia Father   . Hypertension Father   . Cancer Father   . Hyperlipidemia Sister   . Heart disease Brother   . Hypertension Brother   . Hepatitis C Child     ROS: Review of Systems  Constitutional: Positive for weight loss.  Cardiovascular: Negative  for chest pain.  Neurological: Negative for headaches.    PHYSICAL EXAM: Blood pressure (!) 141/88, pulse 100, temperature 98.4 F (36.9 C), temperature source Oral, height 5\' 4"  (1.626 m), weight 193 lb (87.5 kg), SpO2 98 %. Body mass index is 33.13 kg/m. Physical Exam Vitals signs reviewed.  Constitutional:      Appearance: Normal appearance. She is well-developed. She is obese.  Cardiovascular:     Rate and Rhythm: Normal rate.  Pulmonary:     Effort: Pulmonary effort is normal.  Musculoskeletal: Normal range of motion.  Skin:    General: Skin is warm and dry.  Neurological:     Mental Status: She is alert and oriented to person, place, and time.  Psychiatric:        Mood and Affect: Mood normal.        Behavior: Behavior normal.     RECENT LABS AND TESTS: BMET    Component Value Date/Time   NA 138 06/11/2018 0843   K 4.1 06/11/2018 0843   CL 97 06/11/2018 0843   CO2 22 06/11/2018 0843   GLUCOSE 111 (H) 06/11/2018 0843   GLUCOSE 113 (H) 01/20/2015 0345   BUN 19 06/11/2018 0843   CREATININE 0.76 06/11/2018 0843   CALCIUM 9.7 06/11/2018 0843   GFRNONAA 83 06/11/2018 0843   GFRAA 96 06/11/2018 0843   Lab Results  Component Value Date   HGBA1C 5.3 06/11/2018   HGBA1C 5.5 04/03/2018   Lab Results  Component Value Date   INSULIN 9.8 06/11/2018     INSULIN 8.2 04/03/2018   CBC    Component Value Date/Time   WBC 5.0 01/20/2015 0345   RBC 3.79 (L) 01/20/2015 0345   HGB 12.0 01/20/2015 0345   HCT 35.5 (L) 01/20/2015 0345   PLT 254 01/20/2015 0345   MCV 93.7 01/20/2015 0345   MCH 31.7 01/20/2015 0345   MCHC 33.8 01/20/2015 0345   RDW 13.5 01/20/2015 0345   Iron/TIBC/Ferritin/ %Sat    Component Value Date/Time   IRON 91 11/26/2016 1007   FERRITIN 39 11/26/2016 1007   Lipid Panel     Component Value Date/Time   CHOL 180 06/11/2018 0843   TRIG 222 (H) 06/11/2018 0843   HDL 38 (L) 06/11/2018 0843   LDLCALC 98 06/11/2018 0843   Hepatic Function Panel     Component Value Date/Time   PROT 6.6 06/11/2018 0843   ALBUMIN 4.8 06/11/2018 0843   AST 15 06/11/2018 0843   ALT 21 06/11/2018 0843   ALKPHOS 53 06/11/2018 0843   BILITOT 0.3 06/11/2018 0843   BILIDIR 0.1 01/17/2015 0032   IBILI 0.8 01/17/2015 0032      Component Value Date/Time   TSH 3.380 04/03/2018 1111   TSH 0.893 01/17/2015 0839   Results for Sylvia Hopkins, Sylvia Hopkins (MRN [id number]) as of 07/31/2018 14:09  Ref. Range 06/11/2018 08:43  Vitamin D, 25-Hydroxy Latest Ref Range: 30.0 - 100.0 ng/mL 39.0     OBESITY BEHAVIORAL INTERVENTION VISIT  Today's visit was # 7   Starting weight: 201 lbs Starting date: 04/03/2018 Today's weight : 194 lbs Today's date: 07/30/2018 Total lbs lost to date: 7   ASK: We discussed the diagnosis of obesity with Sylvia Hopkins today and Iretta agreed to give [place] permission to discuss obesity behavioral modification therapy today.  ASSESS: Zamaria has the diagnosis of obesity and her BMI today is 33.28 Haneen is in the action stage of change   ADVISE: Sylvia Hopkins was educated on the multiple health  risks of obesity as well as the benefit of weight loss to improve her health. She was advised of the need for long term treatment and the importance of lifestyle modifications to improve her current health and to decrease her risk of future  health problems.  AGREE: Multiple dietary modification options and treatment options were discussed and  Teandra agreed to follow the recommendations documented in the above note.  ARRANGE: Fatin was educated on the importance of frequent visits to treat obesity as outlined per CMS and USPSTF guidelines and agreed to schedule her next follow up appointment today.  Corey Skains, am acting as Location manager for [employer]. Owens Shark, DO  I have reviewed the above documentation for accuracy and completeness, and I agree with the above. -Jearld Lesch, DO

## 2018-08-14 ENCOUNTER — Ambulatory Visit (INDEPENDENT_AMBULATORY_CARE_PROVIDER_SITE_OTHER): Payer: 59 | Admitting: Bariatrics

## 2018-08-14 ENCOUNTER — Encounter (INDEPENDENT_AMBULATORY_CARE_PROVIDER_SITE_OTHER): Payer: Self-pay

## 2018-08-18 ENCOUNTER — Encounter (INDEPENDENT_AMBULATORY_CARE_PROVIDER_SITE_OTHER): Payer: Self-pay | Admitting: Bariatrics

## 2018-08-18 ENCOUNTER — Ambulatory Visit (INDEPENDENT_AMBULATORY_CARE_PROVIDER_SITE_OTHER): Payer: 59 | Admitting: Bariatrics

## 2018-08-18 VITALS — BP 150/88 | HR 86 | Temp 98.0°F | Ht 64.0 in | Wt 194.0 lb

## 2018-08-18 DIAGNOSIS — Z9189 Other specified personal risk factors, not elsewhere classified: Secondary | ICD-10-CM | POA: Diagnosis not present

## 2018-08-18 DIAGNOSIS — I10 Essential (primary) hypertension: Secondary | ICD-10-CM | POA: Diagnosis not present

## 2018-08-18 DIAGNOSIS — E669 Obesity, unspecified: Secondary | ICD-10-CM

## 2018-08-18 DIAGNOSIS — E559 Vitamin D deficiency, unspecified: Secondary | ICD-10-CM

## 2018-08-18 DIAGNOSIS — Z6833 Body mass index (BMI) 33.0-33.9, adult: Secondary | ICD-10-CM

## 2018-08-18 MED ORDER — VITAMIN D (ERGOCALCIFEROL) 1.25 MG (50000 UNIT) PO CAPS: 50000.0000 [IU] | ORAL_CAPSULE | ORAL | 0 refills | Status: DC

## 2018-08-18 NOTE — Progress Notes (Signed)
Office: [phone]  /  Fax: [phone]   HPI:   Chief Complaint: OBESITY Sylvia Hopkins is here to discuss her progress with her obesity treatment plan. She is on the Category 2 plan and is following her eating plan approximately 90 % of the time. She states she is exercising 0 minutes 0 times per week. Sylvia Hopkins is doing well. She has been eating out more and larger portions. She is sometimes skipping meals.  Her weight is 194 lb (88 kg) today and has had a weight gain of 1 pound over a period of 2 weeks since her last visit. She has lost 7 lbs since starting treatment with [place].  Vitamin D deficiency Sylvia Hopkins has a diagnosis of vitamin D deficiency. She is currently taking vit D and her last level was 39.0 on 06/11/18.  At risk for osteopenia and osteoporosis Sylvia Hopkins is at higher risk of osteopenia and osteoporosis due to vitamin D deficiency.   Hypertension Sylvia Hopkins is a 63 y.o. female with hypertension. Sylvia Hopkins's blood pressure is currently well controlled and she is taking HCTZ.  She is working on weight loss to help control her blood pressure with the goal of decreasing her risk of heart attack and stroke.  ASSESSMENT AND PLAN:  Vitamin D deficiency - Plan: Vitamin D, Ergocalciferol, (DRISDOL) 1.25 MG (50000 UT) CAPS capsule  Essential hypertension  At risk for osteoporosis  Class 1 obesity with serious comorbidity and body mass index (BMI) of 33.0 to 33.9 in adult, unspecified obesity type - Plan: Vitamin D, Ergocalciferol, (DRISDOL) 1.25 MG (50000 UT) CAPS capsule  PLAN:  Vitamin D Deficiency Sylvia Hopkins was informed that low vitamin D levels contributes to fatigue and are associated with obesity, breast, and colon cancer. She agrees to continue to take prescription Vit D @50 ,000 IU every week #4 with no refills and will follow up for routine testing of vitamin D, at least 2-3 times per year. She was informed of the risk of over-replacement of vitamin D and agrees to not increase her  dose unless she discusses this with [place] first. Sylvia Hopkins agreed to follow up in 2 weeks.  At risk for osteopenia and osteoporosis Sylvia Hopkins was given extended (15 minutes) osteoporosis prevention counseling today. Sylvia Hopkins is at risk for osteopenia and osteoporosis due to her vitamin D deficiency. She was encouraged to take her vitamin D and follow her higher calcium diet and increase strengthening exercise to help strengthen her bones and decrease her risk of osteopenia and osteoporosis.  Hypertension We discussed sodium restriction, working on healthy weight loss, and a regular exercise program as the means to achieve improved blood pressure control. We will continue to monitor her blood pressure as well as her progress with the above lifestyle modifications. She will continue her medications as prescribed for hypertension and will watch for signs of hypotension as she continues her lifestyle modifications. Sylvia Hopkins agreed with this plan and agreed to follow up as directed.  Obesity Sylvia Hopkins is currently in the action stage of change. As such, her goal is to continue with weight loss efforts. She has agreed to follow the Category 2 plan. Sylvia Hopkins agreed to increase her water intake and she was give Additional Lunch Options handout. Sylvia Hopkins has been instructed to work up to a goal of 150 minutes of combined cardio and strengthening exercise per week for weight loss and overall health benefits. We discussed the following Behavioral Modification Strategies today: increasing lean protein intake, decreasing simple carbohydrates, increasing vegetables, increase H2O intake, decrease eating  out, and work on meal planning and easy cooking plans.  Sylvia Hopkins has agreed to follow up with our clinic in 2 weeks. She was informed of the importance of frequent follow up visits to maximize her success with intensive lifestyle modifications for her multiple health conditions.  ALLERGIES: Allergies  Allergen Reactions  . Advil  [Ibuprofen] Other (See Comments)    Upset stomach     MEDICATIONS: Current Outpatient Medications on File Prior to Visit  Medication Sig Dispense Refill  . albuterol (PROVENTIL HFA;VENTOLIN HFA) 108 (90 BASE) MCG/ACT inhaler Inhale 2 puffs into the lungs every 6 (six) hours as needed for wheezing or shortness of breath.    . ALPRAZolam (XANAX) 0.5 MG tablet Take 0.5 mg by mouth as needed for anxiety.    Marland Kitchen amphetamine-dextroamphetamine (ADDERALL) 30 MG tablet Take 1 tablet by mouth 2 (two) times daily. 60 tablet 0  . azelastine (ASTELIN) 0.1 % nasal spray Place 1 spray into both nostrils 2 (two) times daily. Use in each nostril as directed    . diphenhydrAMINE (BENADRYL ALLERGY) 25 MG tablet Take 25 mg by mouth every 6 (six) hours as needed.    Marland Kitchen esomeprazole (NEXIUM) 20 MG capsule Take 20 mg by mouth daily at 12 noon.    Marland Kitchen ESTRADIOL-NORETHINDRONE ACET TD Place onto the skin. 0.025-0.14 mg place one patch on skin two times wkly    . famotidine (PEPCID AC) 10 MG chewable tablet Chew 10 mg by mouth daily.    . fluticasone (FLONASE) 50 MCG/ACT nasal spray Place 1 spray into both nostrils daily.    . hydrochlorothiazide (HYDRODIURIL) 12.5 MG tablet Take 1 tablet (12.5 mg total) by mouth daily. 30 tablet 0  . levocetirizine (XYZAL) 5 MG tablet Take 5 mg by mouth every evening.    . mometasone-formoterol (DULERA) 100-5 MCG/ACT AERO daily.    . Multiple Vitamins-Minerals (MULTIVITAMIN WOMEN) TABS Take by mouth.    . Probiotic Product (PROBIOTIC ADVANCED PO) Take 1 tablet by mouth at bedtime.    . traMADol-acetaminophen (ULTRACET) 37.5-325 MG tablet Take 1 tablet by mouth every 6 (six) hours as needed.     No current facility-administered medications on file prior to visit.     PAST MEDICAL HISTORY: Past Medical History:  Diagnosis Date  . ADHD   . Alcohol use   . Anxiety   . Asthma due to environmental allergies   . Back pain   . Constipation   . Depression   . Diverticula of colon   .  Diverticulitis   . Fatigue   . GERD (gastroesophageal reflux disease)   . Hay fever   . HTN (hypertension)   . Hypothyroidism   . Joint pain   . Seasonal allergies   . Shortness of breath on exertion   . Swelling of extremity   . Thyroid disease   . Vision changes   . Vitamin D deficiency     PAST SURGICAL HISTORY: Past Surgical History:  Procedure Laterality Date  . CESAREAN SECTION     x2  . TONSILLECTOMY      SOCIAL HISTORY: Social History   Tobacco Use  . Smoking status: Never Smoker  . Smokeless tobacco: Never Used  Substance Use Topics  . Alcohol use: Yes    Comment: 2-3 drinks per day  . Drug use: No    FAMILY HISTORY: Family History  Problem Relation Age of Onset  . Emphysema Mother   . Depression Mother   . Anxiety disorder Mother   .  Alcoholism Mother   . Prostate cancer Father   . Heart disease Father   . Hyperlipidemia Father   . Hypertension Father   . Cancer Father   . Hyperlipidemia Sister   . Heart disease Brother   . Hypertension Brother   . Hepatitis C Child     ROS: Review of Systems  Constitutional: Negative for weight loss.    PHYSICAL EXAM: Blood pressure (!) 150/88, pulse 86, temperature 98 F (36.7 C), temperature source Oral, height 5\' 4"  (1.626 m), weight 194 lb (88 kg), SpO2 99 %. Body mass index is 33.3 kg/m. Physical Exam Vitals signs reviewed.  Constitutional:      Appearance: Normal appearance. She is obese.  Cardiovascular:     Rate and Rhythm: Normal rate.  Pulmonary:     Effort: Pulmonary effort is normal.  Musculoskeletal: Normal range of motion.  Skin:    General: Skin is warm and dry.  Neurological:     Mental Status: She is alert and oriented to person, place, and time.  Psychiatric:        Mood and Affect: Mood normal.        Behavior: Behavior normal.     RECENT LABS AND TESTS: BMET    Component Value Date/Time   NA 138 06/11/2018 0843   K 4.1 06/11/2018 0843   CL 97 06/11/2018 0843   CO2  22 06/11/2018 0843   GLUCOSE 111 (H) 06/11/2018 0843   GLUCOSE 113 (H) 01/20/2015 0345   BUN 19 06/11/2018 0843   CREATININE 0.76 06/11/2018 0843   CALCIUM 9.7 06/11/2018 0843   GFRNONAA 83 06/11/2018 0843   GFRAA 96 06/11/2018 0843   Lab Results  Component Value Date   HGBA1C 5.3 06/11/2018   HGBA1C 5.5 04/03/2018   Lab Results  Component Value Date   INSULIN 9.8 06/11/2018   INSULIN 8.2 04/03/2018   CBC    Component Value Date/Time   WBC 5.0 01/20/2015 0345   RBC 3.79 (L) 01/20/2015 0345   HGB 12.0 01/20/2015 0345   HCT 35.5 (L) 01/20/2015 0345   PLT 254 01/20/2015 0345   MCV 93.7 01/20/2015 0345   MCH 31.7 01/20/2015 0345   MCHC 33.8 01/20/2015 0345   RDW 13.5 01/20/2015 0345   Iron/TIBC/Ferritin/ %Sat    Component Value Date/Time   IRON 91 11/26/2016 1007   FERRITIN 39 11/26/2016 1007   Lipid Panel     Component Value Date/Time   CHOL 180 06/11/2018 0843   TRIG 222 (H) 06/11/2018 0843   HDL 38 (L) 06/11/2018 0843   LDLCALC 98 06/11/2018 0843   Hepatic Function Panel     Component Value Date/Time   PROT 6.6 06/11/2018 0843   ALBUMIN 4.8 06/11/2018 0843   AST 15 06/11/2018 0843   ALT 21 06/11/2018 0843   ALKPHOS 53 06/11/2018 0843   BILITOT 0.3 06/11/2018 0843   BILIDIR 0.1 01/17/2015 0032   IBILI 0.8 01/17/2015 0032      Component Value Date/Time   TSH 3.380 04/03/2018 1111   TSH 0.893 01/17/2015 0839   Results for Sylvia, Hopkins (MRN [id number]) as of 08/18/2018 11:19  Ref. Range 06/11/2018 08:43  Vitamin D, 25-Hydroxy Latest Ref Range: 30.0 - 100.0 ng/mL 39.0    OBESITY BEHAVIORAL INTERVENTION VISIT  Today's visit was # 8   Starting weight: 201 lbs Starting date: 04/03/18 Today's weight : Weight: 194 lb (88 kg)  Today's date: 08/18/2018 Total lbs lost to date: 7  ASK: We discussed  the diagnosis of obesity with Grafton Folk today and Willow agreed to give [place] permission to discuss obesity behavioral modification therapy  today.  ASSESS: Rudi has the diagnosis of obesity and her BMI today is 33.2. Bristyn is in the action stage of change.   ADVISE: Ariann was educated on the multiple health risks of obesity as well as the benefit of weight loss to improve her health. She was advised of the need for long term treatment and the importance of lifestyle modifications to improve her current health and to decrease her risk of future health problems.  AGREE: Multiple dietary modification options and treatment options were discussed and Giuseppina agreed to follow the recommendations documented in the above note.  ARRANGE: Nikayla was educated on the importance of frequent visits to treat obesity as outlined per CMS and USPSTF guidelines and agreed to schedule her next follow up appointment today.  I, Marcille Blanco, CMA, am acting as Location manager for [employer]. Owens Shark, DO  I have reviewed the above documentation for accuracy and completeness, and I agree with the above. -Jearld Lesch, DO

## 2018-08-27 ENCOUNTER — Other Ambulatory Visit (INDEPENDENT_AMBULATORY_CARE_PROVIDER_SITE_OTHER): Payer: Self-pay | Admitting: Bariatrics

## 2018-08-27 DIAGNOSIS — I10 Essential (primary) hypertension: Secondary | ICD-10-CM

## 2018-09-01 ENCOUNTER — Encounter (INDEPENDENT_AMBULATORY_CARE_PROVIDER_SITE_OTHER): Payer: Self-pay | Admitting: Bariatrics

## 2018-09-01 ENCOUNTER — Ambulatory Visit (INDEPENDENT_AMBULATORY_CARE_PROVIDER_SITE_OTHER): Payer: 59 | Admitting: Bariatrics

## 2018-09-01 VITALS — BP 154/87 | HR 92 | Temp 97.8°F | Ht 64.0 in | Wt 192.0 lb

## 2018-09-01 DIAGNOSIS — Z6833 Body mass index (BMI) 33.0-33.9, adult: Secondary | ICD-10-CM

## 2018-09-01 DIAGNOSIS — I10 Essential (primary) hypertension: Secondary | ICD-10-CM

## 2018-09-01 DIAGNOSIS — E559 Vitamin D deficiency, unspecified: Secondary | ICD-10-CM | POA: Diagnosis not present

## 2018-09-01 DIAGNOSIS — E669 Obesity, unspecified: Secondary | ICD-10-CM

## 2018-09-01 NOTE — Progress Notes (Signed)
Office: [phone]  /  Fax: [phone]   HPI:   Chief Complaint: OBESITY Sylvia Hopkins is here to discuss her progress with her obesity treatment plan. She is on the  follow the Category 2 plan and is following her eating plan approximately 90 % of the time. She states she is exercising 0 minutes 0 times per week. Sylvia Hopkins is doing well overall. She is managing the stress eating. Carmela is "behind in water". Her weight is 192 lb (87.1 kg) today and has had a weight loss of 2 pounds over a period of 2 weeks since her last visit. She has lost 9 lbs since starting treatment with [place].  Vitamin D deficiency Sylvia Hopkins has a diagnosis of vitamin D deficiency. She is currently taking vit D and denies nausea, vomiting or muscle weakness.  Hypertension Sylvia Hopkins is a 63 y.o. female with hypertension.  Grafton Folk denies chest pain or shortness of breath on exertion. She is working weight loss to help control her blood pressure with the goal of decreasing her risk of heart attack and stroke. Marthas blood pressure is not that well controlled.  ASSESSMENT AND PLAN:  Vitamin D deficiency  Essential hypertension  Class 1 obesity with serious comorbidity and body mass index (BMI) of 33.0 to 33.9 in adult, unspecified obesity type  PLAN:  Vitamin D Deficiency [name] was informed that low vitamin D levels contributes to fatigue and are associated with obesity, breast, and colon cancer. She agrees to continue to take prescription Vit D @50 ,000 IU every week and will follow up for routine testing of vitamin D, at least 2-3 times per year. She was informed of the risk of over-replacement of vitamin D and agrees to not increase her dose unless she discusses this with [place] first.  Hypertension We discussed sodium restriction, working on healthy weight loss, and a regular exercise program as the means to achieve improved blood pressure control. [name] agreed with this plan and agreed to follow up as  directed. We will continue to monitor her blood pressure as well as her progress with the above lifestyle modifications. She will continue her medications as prescribed and will watch for signs of hypotension as she continues her lifestyle modifications.  I spent > than 50% of the 15 minute visit on counseling as documented in the note.  Obesity Sylvia Hopkins is currently in the action stage of change. As such, her goal is to continue with weight loss efforts She has agreed to follow the Category 2 plan [name] has been instructed to work up to a goal of 150 minutes of combined cardio and strengthening exercise per week for weight loss and overall health benefits. We discussed the following Behavioral Modification Strategies today: increase H2O intake, no skipping meals, better snacking choices, increasing lean protein intake, decreasing simple carbohydrates, increasing vegetables, decrease eating out, work on meal planning and easy cooking plans and emotional eating strategies Additional lunch options were provided to patient today.  Sylvia Hopkins has agreed to follow up with our clinic in 2 weeks. She was informed of the importance of frequent follow up visits to maximize her success with intensive lifestyle modifications for her multiple health conditions.  ALLERGIES: Allergies  Allergen Reactions  . Advil [Ibuprofen] Other (See Comments)    Upset stomach     MEDICATIONS: Current Outpatient Medications on File Prior to Visit  Medication Sig Dispense Refill  . albuterol (PROVENTIL HFA;VENTOLIN HFA) 108 (90 BASE) MCG/ACT inhaler Inhale 2 puffs into the lungs every  6 (six) hours as needed for wheezing or shortness of breath.    . ALPRAZolam (XANAX) 0.5 MG tablet Take 0.5 mg by mouth as needed for anxiety.    Marland Kitchen amphetamine-dextroamphetamine (ADDERALL) 30 MG tablet Take 1 tablet by mouth 2 (two) times daily. 60 tablet 0  . azelastine (ASTELIN) 0.1 % nasal spray Place 1 spray into both nostrils 2 (two)  times daily. Use in each nostril as directed    . diphenhydrAMINE (BENADRYL ALLERGY) 25 MG tablet Take 25 mg by mouth every 6 (six) hours as needed.    Marland Kitchen esomeprazole (NEXIUM) 20 MG capsule Take 20 mg by mouth daily at 12 noon.    Marland Kitchen ESTRADIOL-NORETHINDRONE ACET TD Place onto the skin. 0.025-0.14 mg place one patch on skin two times wkly    . fluticasone (FLONASE) 50 MCG/ACT nasal spray Place 1 spray into both nostrils daily.    . hydrochlorothiazide (HYDRODIURIL) 12.5 MG tablet TAKE ONE TABLET BY MOUTH DAILY 7 tablet 0  . levocetirizine (XYZAL) 5 MG tablet Take 5 mg by mouth every evening.    . mometasone-formoterol (DULERA) 100-5 MCG/ACT AERO daily.    . Multiple Vitamins-Minerals (MULTIVITAMIN WOMEN) TABS Take by mouth.    . Probiotic Product (PROBIOTIC ADVANCED PO) Take 1 tablet by mouth at bedtime.    . Vitamin D, Ergocalciferol, (DRISDOL) 1.25 MG (50000 UT) CAPS capsule Take 1 capsule (50,000 Units total) by mouth every 7 (seven) days. 4 capsule 0   No current facility-administered medications on file prior to visit.     PAST MEDICAL HISTORY: Past Medical History:  Diagnosis Date  . ADHD   . Alcohol use   . Anxiety   . Asthma due to environmental allergies   . Back pain   . Constipation   . Depression   . Diverticula of colon   . Diverticulitis   . Fatigue   . GERD (gastroesophageal reflux disease)   . Hay fever   . HTN (hypertension)   . Hypothyroidism   . Joint pain   . Seasonal allergies   . Shortness of breath on exertion   . Swelling of extremity   . Thyroid disease   . Vision changes   . Vitamin D deficiency     PAST SURGICAL HISTORY: Past Surgical History:  Procedure Laterality Date  . CESAREAN SECTION     x2  . TONSILLECTOMY      SOCIAL HISTORY: Social History   Tobacco Use  . Smoking status: Never Smoker  . Smokeless tobacco: Never Used  Substance Use Topics  . Alcohol use: Yes    Comment: 2-3 drinks per day  . Drug use: No    FAMILY  HISTORY: Family History  Problem Relation Age of Onset  . Emphysema Mother   . Depression Mother   . Anxiety disorder Mother   . Alcoholism Mother   . Prostate cancer Father   . Heart disease Father   . Hyperlipidemia Father   . Hypertension Father   . Cancer Father   . Hyperlipidemia Sister   . Heart disease Brother   . Hypertension Brother   . Hepatitis C Child     ROS: Review of Systems  Constitutional: Positive for weight loss.  Cardiovascular: Negative for chest pain.  Gastrointestinal: Negative for nausea and vomiting.  Musculoskeletal:       Negative for muscle weakness    PHYSICAL EXAM: Blood pressure (!) 154/87, pulse 92, temperature 97.8 F (36.6 C), temperature source Oral, height 5\' 4"  (  1.626 m), weight 192 lb (87.1 kg), SpO2 99 %. Body mass index is 32.96 kg/m. Physical Exam Vitals signs reviewed.  Constitutional:      Appearance: Normal appearance. She is well-developed. She is obese.  Cardiovascular:     Rate and Rhythm: Normal rate.  Pulmonary:     Effort: Pulmonary effort is normal.  Musculoskeletal: Normal range of motion.  Skin:    General: Skin is warm and dry.  Neurological:     Mental Status: She is alert and oriented to person, place, and time.  Psychiatric:        Mood and Affect: Mood normal.        Behavior: Behavior normal.     RECENT LABS AND TESTS: BMET    Component Value Date/Time   NA 138 06/11/2018 0843   K 4.1 06/11/2018 0843   CL 97 06/11/2018 0843   CO2 22 06/11/2018 0843   GLUCOSE 111 (H) 06/11/2018 0843   GLUCOSE 113 (H) 01/20/2015 0345   BUN 19 06/11/2018 0843   CREATININE 0.76 06/11/2018 0843   CALCIUM 9.7 06/11/2018 0843   GFRNONAA 83 06/11/2018 0843   GFRAA 96 06/11/2018 0843   Lab Results  Component Value Date   HGBA1C 5.3 06/11/2018   HGBA1C 5.5 04/03/2018   Lab Results  Component Value Date   INSULIN 9.8 06/11/2018   INSULIN 8.2 04/03/2018   CBC    Component Value Date/Time   WBC 5.0 01/20/2015  0345   RBC 3.79 (L) 01/20/2015 0345   HGB 12.0 01/20/2015 0345   HCT 35.5 (L) 01/20/2015 0345   PLT 254 01/20/2015 0345   MCV 93.7 01/20/2015 0345   MCH 31.7 01/20/2015 0345   MCHC 33.8 01/20/2015 0345   RDW 13.5 01/20/2015 0345   Iron/TIBC/Ferritin/ %Sat    Component Value Date/Time   IRON 91 11/26/2016 1007   FERRITIN 39 11/26/2016 1007   Lipid Panel     Component Value Date/Time   CHOL 180 06/11/2018 0843   TRIG 222 (H) 06/11/2018 0843   HDL 38 (L) 06/11/2018 0843   LDLCALC 98 06/11/2018 0843   Hepatic Function Panel     Component Value Date/Time   PROT 6.6 06/11/2018 0843   ALBUMIN 4.8 06/11/2018 0843   AST 15 06/11/2018 0843   ALT 21 06/11/2018 0843   ALKPHOS 53 06/11/2018 0843   BILITOT 0.3 06/11/2018 0843   BILIDIR 0.1 01/17/2015 0032   IBILI 0.8 01/17/2015 0032      Component Value Date/Time   TSH 3.380 04/03/2018 1111   TSH 0.893 01/17/2015 0839   Results for AFOMIA, BLACKLEY (MRN [id number]) as of 09/01/2018 17:35  Ref. Range 06/11/2018 08:43  Vitamin D, 25-Hydroxy Latest Ref Range: 30.0 - 100.0 ng/mL 39.0     OBESITY BEHAVIORAL INTERVENTION VISIT  Today's visit was # 9   Starting weight: 201 lbs Starting date: 04/03/2018 Today's weight : 192 lbs  Today's date: 09/01/2018 Total lbs lost to date: 9    09/01/2018  Height 5\' 4"  (1.626 m)  Weight 192 lb (87.1 kg)  BMI (Calculated) 32.94  BLOOD PRESSURE - SYSTOLIC 263  BLOOD PRESSURE - DIASTOLIC 87   Body Fat % 78.5 %  Total Body Water (lbs) 77.2 lbs    ASK: We discussed the diagnosis of obesity with Grafton Folk today and Angelice agreed to give [place] permission to discuss obesity behavioral modification therapy today.  ASSESS: Dominica has the diagnosis of obesity and her BMI today is 32.94 [name]  is in the action stage of change   ADVISE: Larkin was educated on the multiple health risks of obesity as well as the benefit of weight loss to improve her health. She was advised of the need for long  term treatment and the importance of lifestyle modifications to improve her current health and to decrease her risk of future health problems.  AGREE: Multiple dietary modification options and treatment options were discussed and  Tamla agreed to follow the recommendations documented in the above note.  ARRANGE: Shayann was educated on the importance of frequent visits to treat obesity as outlined per CMS and USPSTF guidelines and agreed to schedule her next follow up appointment today.  Corey Skains, am acting as Location manager for [employer]. Owens Shark, DO  I have reviewed the above documentation for accuracy and completeness, and I agree with the above. -Jearld Lesch, DO  I have reviewed the above documentation for accuracy and completeness, and I agree with the above. -Jearld Lesch, DO

## 2018-09-16 ENCOUNTER — Other Ambulatory Visit: Payer: Self-pay

## 2018-09-16 ENCOUNTER — Encounter (INDEPENDENT_AMBULATORY_CARE_PROVIDER_SITE_OTHER): Payer: Self-pay | Admitting: Bariatrics

## 2018-09-16 ENCOUNTER — Ambulatory Visit (INDEPENDENT_AMBULATORY_CARE_PROVIDER_SITE_OTHER): Payer: 59 | Admitting: Bariatrics

## 2018-09-16 VITALS — BP 127/87 | HR 100 | Temp 97.9°F | Ht 64.0 in | Wt 191.0 lb

## 2018-09-16 DIAGNOSIS — E559 Vitamin D deficiency, unspecified: Secondary | ICD-10-CM

## 2018-09-16 DIAGNOSIS — Z6832 Body mass index (BMI) 32.0-32.9, adult: Secondary | ICD-10-CM

## 2018-09-16 DIAGNOSIS — E669 Obesity, unspecified: Secondary | ICD-10-CM

## 2018-09-16 DIAGNOSIS — I10 Essential (primary) hypertension: Secondary | ICD-10-CM

## 2018-09-16 MED ORDER — VITAMIN D (ERGOCALCIFEROL) 1.25 MG (50000 UNIT) PO CAPS: 50000.0000 [IU] | ORAL_CAPSULE | ORAL | 0 refills | Status: DC

## 2018-09-16 MED ORDER — HYDROCHLOROTHIAZIDE 12.5 MG PO TABS: 12.5000 mg | ORAL_TABLET | Freq: Every day | ORAL | 0 refills | Status: DC

## 2018-09-17 NOTE — Progress Notes (Signed)
Office: [phone]  /  Fax: [phone]   HPI:   Chief Complaint: OBESITY Sylvia Hopkins is here to discuss her progress with her obesity treatment plan. She is on the Category 2 plan and is following her eating plan approximately 90-95% of the time. She states she is exercising 0 minutes 0 times per week. [name] states that she is doing better now that her "tax session is over." Her weight is 191 lb (86.6 kg) today and has had a weight loss of 1 pound over a period of 2 weeks since her last visit. She has lost 10 lbs since starting treatment with [place].  Vitamin D deficiency Sylvia Hopkins has a diagnosis of Vitamin D deficiency. She is currently taking prescription Vit D and denies nausea, vomiting or muscle weakness.  At risk for osteopenia and osteoporosis Sylvia Hopkins is at higher risk of osteopenia and osteoporosis due to Vitamin D deficiency.   Hypertension CATHYE KREITER is a 63 y.o. female with hypertension reasonably well controlled on Hydrochlorathiazide. Grafton Folk denies chest pain or shortness of breath on exertion. She is working weight loss to help control her blood pressure with the goal of decreasing her risk of heart attack and stroke. Marthas blood pressure is currently controlled.  ASSESSMENT AND PLAN:  Vitamin D deficiency - Plan: Vitamin D, Ergocalciferol, (DRISDOL) 1.25 MG (50000 UT) CAPS capsule  Essential hypertension - Plan: hydrochlorothiazide (HYDRODIURIL) 12.5 MG tablet  Class 1 obesity with serious comorbidity and body mass index (BMI) of 32.0 to 32.9 in adult, unspecified obesity type  PLAN:  Vitamin D Deficiency Sylvia Hopkins was informed that low Vitamin D levels contributes to fatigue and are associated with obesity, breast, and colon cancer. She agrees to continue to take prescription Vit D @ 50,000 IU every week #4 with 0 refillsand will follow-up for routine testing of Vitamin D, at least 2-3 times per year. She was informed of the risk of over-replacement of Vitamin  D and agrees to not increase her dose unless she discusses this with [place] first. Sylvia Hopkins agrees to follow-up with our clinic in 2 weeks.  At risk for osteopenia and osteoporosis Sylvia Hopkins was given extended  (15 minutes) osteoporosis prevention counseling today. Honor is at risk for osteopenia and osteoporsis due to her Vitamin D deficiency. She was encouraged to take her Vitamin D and follow her higher calcium diet and increase strengthening exercise to help strengthen her bones and decrease her risk of osteopenia and osteoporosis.  Hypertension We discussed sodium restriction, working on healthy weight loss, and a regular exercise program as the means to achieve improved blood pressure control. Sylvia Hopkins agreed with this plan and agreed to follow up as directed. We will continue to monitor her blood pressure as well as her progress with the above lifestyle modifications. She was given a prescription for Hydrochlorothiazide 12.5 mg 1 qd #30 with 0 refills and will watch for signs of hypotension as she continues her lifestyle modifications.  Obesity [name] is currently in the action stage of change. As such, her goal is to continue with weight loss efforts. She has agreed to follow the Category 2 plan.  For breakfast she will journal 200-300 calories and 20+ grams of protein. For dinner she will journal 400-500 calories and 35+ grams of protein. Sylvia Hopkins was advised to increase protein intake and H20 intake. Sylvia Hopkins has been instructed to begin walking 2 times per week. We discussed the following Behavioral Modification Strategies today: increasing lean protein intake, decreasing simple carbohydrates, increasing vegetables, increase  H20 intake, decrease eating out, no skipping meals, work on meal planning and easy cooking plans, and keeping healthy foods in the home.  Sylvia Hopkins has agreed to follow-up with our clinic in 2 weeks, fasting. She was informed of the importance of frequent follow-up visits to  maximize her success with intensive lifestyle modifications for her multiple health conditions.  ALLERGIES: Allergies  Allergen Reactions  . Advil [Ibuprofen] Other (See Comments)    Upset stomach     MEDICATIONS: Current Outpatient Medications on File Prior to Visit  Medication Sig Dispense Refill  . albuterol (PROVENTIL HFA;VENTOLIN HFA) 108 (90 BASE) MCG/ACT inhaler Inhale 2 puffs into the lungs every 6 (six) hours as needed for wheezing or shortness of breath.    . ALPRAZolam (XANAX) 0.5 MG tablet Take 0.5 mg by mouth as needed for anxiety.    Sylvia Hopkins amphetamine-dextroamphetamine (ADDERALL) 30 MG tablet Take 1 tablet by mouth 2 (two) times daily. 60 tablet 0  . azelastine (ASTELIN) 0.1 % nasal spray Place 1 spray into both nostrils 2 (two) times daily. Use in each nostril as directed    . diphenhydrAMINE (BENADRYL ALLERGY) 25 MG tablet Take 25 mg by mouth every 6 (six) hours as needed.    Sylvia Hopkins esomeprazole (NEXIUM) 20 MG capsule Take 20 mg by mouth daily at 12 noon.    Sylvia Hopkins ESTRADIOL-NORETHINDRONE ACET TD Place onto the skin. 0.025-0.14 mg place one patch on skin two times wkly    . fluticasone (FLONASE) 50 MCG/ACT nasal spray Place 1 spray into both nostrils daily.    Sylvia Hopkins levocetirizine (XYZAL) 5 MG tablet Take 5 mg by mouth every evening.    . mometasone-formoterol (DULERA) 100-5 MCG/ACT AERO daily.    . Multiple Vitamins-Minerals (MULTIVITAMIN WOMEN) TABS Take by mouth.    . Probiotic Product (PROBIOTIC ADVANCED PO) Take 1 tablet by mouth at bedtime.     No current facility-administered medications on file prior to visit.     PAST MEDICAL HISTORY: Past Medical History:  Diagnosis Date  . ADHD   . Alcohol use   . Anxiety   . Asthma due to environmental allergies   . Back pain   . Constipation   . Depression   . Diverticula of colon   . Diverticulitis   . Fatigue   . GERD (gastroesophageal reflux disease)   . Hay fever   . HTN (hypertension)   . Hypothyroidism   . Joint pain   .  Seasonal allergies   . Shortness of breath on exertion   . Swelling of extremity   . Thyroid disease   . Vision changes   . Vitamin D deficiency     PAST SURGICAL HISTORY: Past Surgical History:  Procedure Laterality Date  . CESAREAN SECTION     x2  . TONSILLECTOMY      SOCIAL HISTORY: Social History   Tobacco Use  . Smoking status: Never Smoker  . Smokeless tobacco: Never Used  Substance Use Topics  . Alcohol use: Yes    Comment: 2-3 drinks per day  . Drug use: No    FAMILY HISTORY: Family History  Problem Relation Age of Onset  . Emphysema Mother   . Depression Mother   . Anxiety disorder Mother   . Alcoholism Mother   . Prostate cancer Father   . Heart disease Father   . Hyperlipidemia Father   . Hypertension Father   . Cancer Father   . Hyperlipidemia Sister   . Heart disease Brother   .  Hypertension Brother   . Hepatitis C Child    ROS: Review of Systems  Constitutional: Positive for weight loss.  Gastrointestinal: Negative for nausea and vomiting.  Musculoskeletal:       Negative for muscle weakness.  Neurological:       Negative for lightheadedness.  Endo/Heme/Allergies:       Negative for hypoglycemia.   PHYSICAL EXAM: Blood pressure 127/87, pulse 100, temperature 97.9 F (36.6 C), temperature source Oral, height 5\' 4"  (1.626 m), weight 191 lb (86.6 kg), SpO2 97 %. Body mass index is 32.79 kg/m. Physical Exam Vitals signs reviewed.  Constitutional:      Appearance: Normal appearance. She is obese.  Cardiovascular:     Rate and Rhythm: Normal rate.     Pulses: Normal pulses.  Pulmonary:     Effort: Pulmonary effort is normal.     Breath sounds: Normal breath sounds.  Musculoskeletal: Normal range of motion.  Skin:    General: Skin is warm and dry.  Neurological:     Mental Status: She is alert and oriented to person, place, and time.  Psychiatric:        Behavior: Behavior normal.   RECENT LABS AND TESTS: BMET    Component Value  Date/Time   NA 138 06/11/2018 0843   K 4.1 06/11/2018 0843   CL 97 06/11/2018 0843   CO2 22 06/11/2018 0843   GLUCOSE 111 (H) 06/11/2018 0843   GLUCOSE 113 (H) 01/20/2015 0345   BUN 19 06/11/2018 0843   CREATININE 0.76 06/11/2018 0843   CALCIUM 9.7 06/11/2018 0843   GFRNONAA 83 06/11/2018 0843   GFRAA 96 06/11/2018 0843   Lab Results  Component Value Date   HGBA1C 5.3 06/11/2018   HGBA1C 5.5 04/03/2018   Lab Results  Component Value Date   INSULIN 9.8 06/11/2018   INSULIN 8.2 04/03/2018   CBC    Component Value Date/Time   WBC 5.0 01/20/2015 0345   RBC 3.79 (L) 01/20/2015 0345   HGB 12.0 01/20/2015 0345   HCT 35.5 (L) 01/20/2015 0345   PLT 254 01/20/2015 0345   MCV 93.7 01/20/2015 0345   MCH 31.7 01/20/2015 0345   MCHC 33.8 01/20/2015 0345   RDW 13.5 01/20/2015 0345   Iron/TIBC/Ferritin/ %Sat    Component Value Date/Time   IRON 91 11/26/2016 1007   FERRITIN 39 11/26/2016 1007   Lipid Panel     Component Value Date/Time   CHOL 180 06/11/2018 0843   TRIG 222 (H) 06/11/2018 0843   HDL 38 (L) 06/11/2018 0843   LDLCALC 98 06/11/2018 0843   Hepatic Function Panel     Component Value Date/Time   PROT 6.6 06/11/2018 0843   ALBUMIN 4.8 06/11/2018 0843   AST 15 06/11/2018 0843   ALT 21 06/11/2018 0843   ALKPHOS 53 06/11/2018 0843   BILITOT 0.3 06/11/2018 0843   BILIDIR 0.1 01/17/2015 0032   IBILI 0.8 01/17/2015 0032      Component Value Date/Time   TSH 3.380 04/03/2018 1111   TSH 0.893 01/17/2015 0839   Results for SHADANA, PRY (MRN [id number]) as of 09/17/2018 08:27  Ref. Range 06/11/2018 08:43  Vitamin D, 25-Hydroxy Latest Ref Range: 30.0 - 100.0 ng/mL 39.0   OBESITY BEHAVIORAL INTERVENTION VISIT  Today's visit was #10   Starting weight: 201 lbs Starting date: 04/03/2018 Today's weight: 191 lbs  Today's date: 09/16/2018 Total lbs lost to date: 10    09/16/2018  Height 5\' 4"  (1.626 m)  Weight 191  lb (86.6 kg)  BMI (Calculated) 32.77  BLOOD  PRESSURE - SYSTOLIC 284  BLOOD PRESSURE - DIASTOLIC 87   Body Fat % 13.2 %  Total Body Water (lbs) 78.4 lbs   ASK: We discussed the diagnosis of obesity with Grafton Folk today and Katilynn agreed to give [place] permission to discuss obesity behavioral modification therapy today.  ASSESS: Shannin has the diagnosis of obesity and her BMI today is 32.77. Jewelianna is in the action stage of change.   ADVISE: Socorro was educated on the multiple health risks of obesity as well as the benefit of weight loss to improve her health. She was advised of the need for long term treatment and the importance of lifestyle modifications to improve her current health and to decrease her risk of future health problems.  AGREE: Multiple dietary modification options and treatment options were discussed and  Verlean agreed to follow the recommendations documented in the above note.  ARRANGE: Mlissa was educated on the importance of frequent visits to treat obesity as outlined per CMS and USPSTF guidelines and agreed to schedule her next follow-up appointment today.  Migdalia Dk, am acting as Location manager for [employer], DO  I have reviewed the above documentation for accuracy and completeness, and I agree with the above. -Jearld Lesch, DO

## 2018-09-18 ENCOUNTER — Other Ambulatory Visit (INDEPENDENT_AMBULATORY_CARE_PROVIDER_SITE_OTHER): Payer: Self-pay | Admitting: Bariatrics

## 2018-09-18 DIAGNOSIS — E559 Vitamin D deficiency, unspecified: Secondary | ICD-10-CM

## 2018-10-01 ENCOUNTER — Encounter (INDEPENDENT_AMBULATORY_CARE_PROVIDER_SITE_OTHER): Payer: Self-pay

## 2018-10-06 ENCOUNTER — Encounter (INDEPENDENT_AMBULATORY_CARE_PROVIDER_SITE_OTHER): Payer: Self-pay

## 2018-10-06 ENCOUNTER — Encounter (INDEPENDENT_AMBULATORY_CARE_PROVIDER_SITE_OTHER): Payer: Self-pay | Admitting: Bariatrics

## 2018-10-06 ENCOUNTER — Ambulatory Visit (INDEPENDENT_AMBULATORY_CARE_PROVIDER_SITE_OTHER): Payer: 59 | Admitting: Bariatrics

## 2018-10-06 ENCOUNTER — Other Ambulatory Visit: Payer: Self-pay

## 2018-10-06 DIAGNOSIS — E559 Vitamin D deficiency, unspecified: Secondary | ICD-10-CM

## 2018-10-06 DIAGNOSIS — I10 Essential (primary) hypertension: Secondary | ICD-10-CM

## 2018-10-06 DIAGNOSIS — E669 Obesity, unspecified: Secondary | ICD-10-CM | POA: Diagnosis not present

## 2018-10-06 DIAGNOSIS — Z6832 Body mass index (BMI) 32.0-32.9, adult: Secondary | ICD-10-CM

## 2018-10-06 NOTE — Progress Notes (Addendum)
Office: [phone]  /  Fax: [phone] TeleHealth Visit:  Sylvia Hopkins has consented to this TeleHealth visit today via Max. The patient is located in an area parking lot, the provider is located at the [facility] office. The participants in this visit include the listed provider and patient and any and all parties involved.   HPI:   Chief Complaint: OBESITY Sylvia Hopkins is here to discuss her progress with her obesity treatment plan. She is on the  keep a food journal with 200 to 300 calories and 20+ grams of protein at breakfast daily and 400 to 500 calories and 35+ grams of protein at dinner daily and the Category 2 plan and is following her eating plan approximately 100 % of the time. She states she is walking for 20 to 30 minutes 4 times per week. Sylvia Hopkins is doing very well. She has been able to find the foods that she needs. Sylvia Hopkins has done some stress eating. She is working from home. We were unable to weight the patient today for this TeleHealth visit.She feels as if she has gained 1 to 2 pounds weight since her last visit. She has lost 10 lbs since starting treatment with [place].  Hypertension Sylvia Hopkins is a 63 y.o. female with hypertension. She is on HCTZ. Her last blood pressure was at 127/87 on 09/16/18. Sylvia Hopkins denies chest pain or shortness of breath on exertion. She is working weight loss to help control her blood pressure with the goal of decreasing her risk of heart attack and stroke.   Vitamin D deficiency Sylvia Hopkins has a diagnosis of vitamin D deficiency. She is currently taking vit D and denies nausea, vomiting or muscle weakness.  ASSESSMENT AND PLAN:  Essential hypertension  Vitamin D deficiency  Class 1 obesity with serious comorbidity and body mass index (BMI) of 32.0 to 32.9 in adult, unspecified obesity type  PLAN:  Hypertension We discussed sodium restriction, working on healthy weight loss, and a regular exercise program as the  means to achieve improved blood pressure control. Sylvia Hopkins agreed with this plan and agreed to follow up as directed. We will continue to monitor her blood pressure as well as her progress with the above lifestyle modifications. Sylvia Hopkins will continue her medications as prescribed and she will start checking her blood pressure. She will watch for signs of hypotension as she continues her lifestyle modifications.  Vitamin D Deficiency Sylvia Hopkins was informed that low vitamin D levels contributes to fatigue and are associated with obesity, breast, and colon cancer. She agrees to continue to take prescription Vit D @50 ,000 IU every week and will follow up for routine testing of vitamin D, at least 2-3 times per year. She was informed of the risk of over-replacement of vitamin D and agrees to not increase her dose unless she discusses this with [place] first.  Obesity Sylvia Hopkins is currently in the action stage of change. As such, her goal is to continue with weight loss efforts She has agreed to keep a food journal with 400 to 500 calories and 35 grams of protein at supper daily and follow the Category 2 plan Sylvia Hopkins has been instructed to increase walking for weight loss and overall health benefits. We discussed the following Behavioral Modification Strategies today: increase H2O intake, no skipping meals, keeping healthy foods in the home, increasing lean protein intake, decreasing simple carbohydrates , increasing vegetables, decrease eating out and work on meal planning and easy cooking plans  Sylvia Hopkins has  agreed to follow up with our clinic in 2 weeks. She was informed of the importance of frequent follow up visits to maximize her success with intensive lifestyle modifications for her multiple health conditions.  ALLERGIES: Allergies  Allergen Reactions  . Advil [Ibuprofen] Other (See Comments)    Upset stomach     MEDICATIONS: Current Outpatient Medications on File Prior to Visit  Medication Sig Dispense  Refill  . albuterol (PROVENTIL HFA;VENTOLIN HFA) 108 (90 BASE) MCG/ACT inhaler Inhale 2 puffs into the lungs every 6 (six) hours as needed for wheezing or shortness of breath.    . ALPRAZolam (XANAX) 0.5 MG tablet Take 0.5 mg by mouth as needed for anxiety.    Marland Kitchen amphetamine-dextroamphetamine (ADDERALL) 30 MG tablet Take 1 tablet by mouth 2 (two) times daily. 60 tablet 0  . azelastine (ASTELIN) 0.1 % nasal spray Place 1 spray into both nostrils 2 (two) times daily. Use in each nostril as directed    . diphenhydrAMINE (BENADRYL ALLERGY) 25 MG tablet Take 25 mg by mouth every 6 (six) hours as needed.    Marland Kitchen esomeprazole (NEXIUM) 20 MG capsule Take 20 mg by mouth daily at 12 noon.    Marland Kitchen ESTRADIOL-NORETHINDRONE ACET TD Place onto the skin. 0.025-0.14 mg place one patch on skin two times wkly    . fluticasone (FLONASE) 50 MCG/ACT nasal spray Place 1 spray into both nostrils daily.    . hydrochlorothiazide (HYDRODIURIL) 12.5 MG tablet Take 1 tablet (12.5 mg total) by mouth daily. 30 tablet 0  . levocetirizine (XYZAL) 5 MG tablet Take 5 mg by mouth every evening.    . mometasone-formoterol (DULERA) 100-5 MCG/ACT AERO daily.    . Multiple Vitamins-Minerals (MULTIVITAMIN WOMEN) TABS Take by mouth.    . Probiotic Product (PROBIOTIC ADVANCED PO) Take 1 tablet by mouth at bedtime.    . Vitamin D, Ergocalciferol, (DRISDOL) 1.25 MG (50000 UT) CAPS capsule Take 1 capsule (50,000 Units total) by mouth every 7 (seven) days. 4 capsule 0   No current facility-administered medications on file prior to visit.     PAST MEDICAL HISTORY: Past Medical History:  Diagnosis Date  . ADHD   . Alcohol use   . Anxiety   . Asthma due to environmental allergies   . Back pain   . Constipation   . Depression   . Diverticula of colon   . Diverticulitis   . Fatigue   . GERD (gastroesophageal reflux disease)   . Hay fever   . HTN (hypertension)   . Hypothyroidism   . Joint pain   . Seasonal allergies   . Shortness of  breath on exertion   . Swelling of extremity   . Thyroid disease   . Vision changes   . Vitamin D deficiency     PAST SURGICAL HISTORY: Past Surgical History:  Procedure Laterality Date  . CESAREAN SECTION     x2  . TONSILLECTOMY      SOCIAL HISTORY: Social History   Tobacco Use  . Smoking status: Never Smoker  . Smokeless tobacco: Never Used  Substance Use Topics  . Alcohol use: Yes    Comment: 2-3 drinks per day  . Drug use: No    FAMILY HISTORY: Family History  Problem Relation Age of Onset  . Emphysema Mother   . Depression Mother   . Anxiety disorder Mother   . Alcoholism Mother   . Prostate cancer Father   . Heart disease Father   . Hyperlipidemia Father   .  Hypertension Father   . Cancer Father   . Hyperlipidemia Sister   . Heart disease Brother   . Hypertension Brother   . Hepatitis C Child     ROS: Review of Systems  Constitutional: Positive for weight loss.  Respiratory: Negative for shortness of breath (on exertion).   Cardiovascular: Negative for chest pain.  Gastrointestinal: Negative for nausea and vomiting.  Musculoskeletal:       Negative for muscle weakness    PHYSICAL EXAM: Pt in no acute distress  RECENT LABS AND TESTS: BMET    Component Value Date/Time   NA 138 06/11/2018 0843   K 4.1 06/11/2018 0843   CL 97 06/11/2018 0843   CO2 22 06/11/2018 0843   GLUCOSE 111 (H) 06/11/2018 0843   GLUCOSE 113 (H) 01/20/2015 0345   BUN 19 06/11/2018 0843   CREATININE 0.76 06/11/2018 0843   CALCIUM 9.7 06/11/2018 0843   GFRNONAA 83 06/11/2018 0843   GFRAA 96 06/11/2018 0843   Lab Results  Component Value Date   HGBA1C 5.3 06/11/2018   HGBA1C 5.5 04/03/2018   Lab Results  Component Value Date   INSULIN 9.8 06/11/2018   INSULIN 8.2 04/03/2018   CBC    Component Value Date/Time   WBC 5.0 01/20/2015 0345   RBC 3.79 (L) 01/20/2015 0345   HGB 12.0 01/20/2015 0345   HCT 35.5 (L) 01/20/2015 0345   PLT 254 01/20/2015 0345   MCV  93.7 01/20/2015 0345   MCH 31.7 01/20/2015 0345   MCHC 33.8 01/20/2015 0345   RDW 13.5 01/20/2015 0345   Iron/TIBC/Ferritin/ %Sat    Component Value Date/Time   IRON 91 11/26/2016 1007   FERRITIN 39 11/26/2016 1007   Lipid Panel     Component Value Date/Time   CHOL 180 06/11/2018 0843   TRIG 222 (H) 06/11/2018 0843   HDL 38 (L) 06/11/2018 0843   LDLCALC 98 06/11/2018 0843   Hepatic Function Panel     Component Value Date/Time   PROT 6.6 06/11/2018 0843   ALBUMIN 4.8 06/11/2018 0843   AST 15 06/11/2018 0843   ALT 21 06/11/2018 0843   ALKPHOS 53 06/11/2018 0843   BILITOT 0.3 06/11/2018 0843   BILIDIR 0.1 01/17/2015 0032   IBILI 0.8 01/17/2015 0032      Component Value Date/Time   TSH 3.380 04/03/2018 1111   TSH 0.893 01/17/2015 0839   Results for LATAYVIA, MANDUJANO (MRN [id number]) as of 10/06/2018 15:54  Ref. Range 06/11/2018 08:43  Vitamin D, 25-Hydroxy Latest Ref Range: 30.0 - 100.0 ng/mL 39.0     I, Doreene Nest, am acting as Location manager for [employer]. Owens Shark, DO  I have reviewed the above documentation for accuracy and completeness, and I agree with the above. -Jearld Lesch, DO

## 2018-10-14 ENCOUNTER — Other Ambulatory Visit (INDEPENDENT_AMBULATORY_CARE_PROVIDER_SITE_OTHER): Payer: Self-pay | Admitting: Bariatrics

## 2018-10-14 DIAGNOSIS — E559 Vitamin D deficiency, unspecified: Secondary | ICD-10-CM

## 2018-10-14 DIAGNOSIS — J4520 Mild intermittent asthma, uncomplicated: Secondary | ICD-10-CM | POA: Diagnosis not present

## 2018-10-20 ENCOUNTER — Other Ambulatory Visit: Payer: Self-pay

## 2018-10-20 ENCOUNTER — Encounter (INDEPENDENT_AMBULATORY_CARE_PROVIDER_SITE_OTHER): Payer: Self-pay

## 2018-10-20 ENCOUNTER — Ambulatory Visit (INDEPENDENT_AMBULATORY_CARE_PROVIDER_SITE_OTHER): Payer: 59 | Admitting: Bariatrics

## 2018-11-05 ENCOUNTER — Encounter (INDEPENDENT_AMBULATORY_CARE_PROVIDER_SITE_OTHER): Payer: Self-pay

## 2018-11-05 ENCOUNTER — Other Ambulatory Visit (INDEPENDENT_AMBULATORY_CARE_PROVIDER_SITE_OTHER): Payer: Self-pay | Admitting: Bariatrics

## 2018-11-05 DIAGNOSIS — E559 Vitamin D deficiency, unspecified: Secondary | ICD-10-CM

## 2018-11-05 DIAGNOSIS — I10 Essential (primary) hypertension: Secondary | ICD-10-CM

## 2019-08-08 ENCOUNTER — Ambulatory Visit: Payer: 59

## 2019-08-13 ENCOUNTER — Ambulatory Visit: Payer: Medicare Other | Attending: Internal Medicine

## 2019-08-13 DIAGNOSIS — Z23 Encounter for immunization: Secondary | ICD-10-CM

## 2019-08-13 NOTE — Progress Notes (Signed)
   Covid-19 Vaccination Clinic  Name:  Sylvia Hopkins    MRN: [id number] DOB: January 16, 1956  08/13/2019  [name] was observed post Covid-19 immunization for 15 minutes without incidence. She was provided with Vaccine Information Sheet and instruction to access the V-Safe system.   [name] was instructed to call 911 with any severe reactions post vaccine: Marland Kitchen Difficulty breathing  . Swelling of your face and throat  . A fast heartbeat  . A bad rash all over your body  . Dizziness and weakness    Immunizations Administered    Name Date Dose VIS Date Route   Pfizer COVID-19 Vaccine 08/13/2019  8:44 AM 0.3 mL 06/19/2019 Intramuscular   Manufacturer: [place]   Lot: [id number]   [place]: [id number]

## 2019-08-19 ENCOUNTER — Ambulatory Visit: Payer: 59

## 2019-09-07 ENCOUNTER — Ambulatory Visit: Payer: Medicare Other | Attending: Internal Medicine

## 2019-09-07 DIAGNOSIS — Z23 Encounter for immunization: Secondary | ICD-10-CM

## 2019-09-07 NOTE — Progress Notes (Signed)
   Covid-19 Vaccination Clinic  Name:  Sylvia Hopkins    MRN: [id number] DOB: 04-Apr-1956  09/07/2019  [name] was observed post Covid-19 immunization for 15 minutes without incidence. She was provided with Vaccine Information Sheet and instruction to access the V-Safe system.   [name] was instructed to call 911 with any severe reactions post vaccine: Marland Kitchen Difficulty breathing  . Swelling of your face and throat  . A fast heartbeat  . A bad rash all over your body  . Dizziness and weakness    Immunizations Administered    Name Date Dose VIS Date Route   Pfizer COVID-19 Vaccine 09/07/2019  9:21 AM 0.3 mL 06/19/2019 Intramuscular   Manufacturer: [place]   Lot: [id number]   [place]: [id number]

## 2020-07-09 DIAGNOSIS — R42 Dizziness and giddiness: Secondary | ICD-10-CM

## 2020-07-09 HISTORY — DX: Dizziness and giddiness: R42

## 2020-11-16 ENCOUNTER — Other Ambulatory Visit: Payer: Self-pay | Admitting: Physician Assistant

## 2020-11-16 DIAGNOSIS — R1010 Upper abdominal pain, unspecified: Secondary | ICD-10-CM

## 2020-11-16 DIAGNOSIS — K2100 Gastro-esophageal reflux disease with esophagitis, without bleeding: Secondary | ICD-10-CM

## 2020-11-18 ENCOUNTER — Ambulatory Visit
Admission: RE | Admit: 2020-11-18 | Discharge: 2020-11-18 | Disposition: A | Discharge status: Home / Self Care | Payer: Medicare Other | Source: Ambulatory Visit | Attending: Physician Assistant | Admitting: Physician Assistant

## 2020-11-18 ENCOUNTER — Other Ambulatory Visit: Payer: Self-pay

## 2020-11-18 DIAGNOSIS — R1010 Upper abdominal pain, unspecified: Secondary | ICD-10-CM

## 2020-11-18 DIAGNOSIS — K2100 Gastro-esophageal reflux disease with esophagitis, without bleeding: Secondary | ICD-10-CM

## 2020-11-24 NOTE — Patient Instructions (Addendum)
DUE TO COVID-19 ONLY ONE VISITOR IS ALLOWED TO COME WITH YOU AND STAY IN THE WAITING ROOM ONLY DURING PRE OP AND PROCEDURE DAY OF SURGERY. THE 1 VISITOR  MAY VISIT WITH YOU AFTER SURGERY IN YOUR PRIVATE ROOM DURING VISITING HOURS ONLY!  Sylvia Hopkins    Your procedure is scheduled on: 12/08/20   Report to [facility] Main  Entrance   Report to admitting at  7:30 AM     Call this number if you have problems the morning of surgery [place], NO [place].  No food after midnight.    You may have clear liquid until 7:00 AM.    At 6:30 AM drink pre surgery drink.   Nothing by mouth after 7:00 AM.    Take these medicines the morning of surgery with A SIP OF WATER: Ventolin- use and bring it with you allegra and flonase and Xanax if needed                                You may not have any metal on your body including hair pins and              piercings  Do not wear jewelry, make-up, lotions, powders or perfumes, deodorant             Do not wear nail polish on your fingernails.  Do not shave  48 hours prior to surgery.  .   Do not bring valuables to the hospital. [place].  Contacts, dentures or bridgework may not be worn into surgery.      Patients discharged the day of surgery will not be allowed to drive home.   IF YOU ARE HAVING SURGERY AND GOING HOME THE SAME DAY, YOU MUST HAVE AN ADULT TO DRIVE YOU HOME AND BE WITH YOU FOR 24 HOURS.   YOU MAY GO HOME BY TAXI OR UBER OR ORTHERWISE, BUT AN ADULT MUST ACCOMPANY YOU HOME AND STAY WITH YOU FOR 24 HOURS.  Name and phone number of your driver:  Special Instructions: N/A              Please read over the following fact sheets you were given: _____________________________________________________________________  [facility]- Preparing for Total Shoulder  Arthroplasty    Before surgery, you can play an important role. Because skin is not sterile, your skin needs to be as free of germs as possible. You can reduce the number of germs on your skin by using the following products. . Benzoyl Peroxide Gel o Reduces the number of germs present on the skin o Applied twice a day to shoulder area starting two days before surgery    ==================================================================  Please follow these instructions carefully:  BENZOYL PEROXIDE 5% GEL  Please do not use if you have an allergy to benzoyl peroxide.   If your skin becomes reddened/irritated stop using the benzoyl peroxide.  Starting two days before surgery, apply as follows: 1. Apply benzoyl peroxide in the morning and at night. Apply after taking a shower. If you are not taking a shower clean entire shoulder front, back, and side along  with the armpit with a clean wet washcloth.  2. Place a quarter-sized dollop on your shoulder and rub in thoroughly, making sure to cover the front, back, and side of your shoulder, along with the armpit.   2 days before ____ AM   ____ PM              1 day before ____ AM   ____ PM                         3. Do this twice a day for two days.  (Last application is the night before surgery, AFTER using the CHG soap as described below).  4. Do NOT apply benzoyl peroxide gel on the day of surgery.            [place] - Preparing for Surgery Before surgery, you can play an important role.  Because skin is not sterile, your skin needs to be as free of germs as possible.  You can reduce the number of germs on your skin by washing with CHG (chlorahexidine gluconate) soap before surgery.  CHG is an antiseptic cleaner which kills germs and bonds with the skin to continue killing germs even after washing. Please DO NOT use if you have an allergy to CHG or antibacterial soaps.  If your skin becomes reddened/irritated stop using the CHG and  inform your nurse when you arrive at Short Stay. Do not shave (including legs and underarms) for at least 48 hours prior to the first CHG shower. . Please follow these instructions carefully:  1.  Shower with CHG Soap the night before surgery and the  morning of Surgery.  2.  If you choose to wash your hair, wash your hair first as usual with your  normal  shampoo.  3.  After you shampoo, rinse your hair and body thoroughly to remove the  shampoo.                                        4.  Use CHG as you would any other liquid soap.  You can apply chg directly  to the skin and wash                       Gently with a scrungie or clean washcloth.  5.  Apply the CHG Soap to your body ONLY FROM THE NECK DOWN.   Do not use on face/ open                           Wound or open sores. Avoid contact with eyes, ears mouth and genitals (private parts).                       Wash face,  Genitals (private parts) with your normal soap.             6.  Wash thoroughly, paying special attention to the area where your surgery  will be performed.  7.  Thoroughly rinse your body with warm water from the neck down.  8.  DO NOT shower/wash with your normal soap after using and rinsing off  the CHG Soap.             9.  Pat yourself dry with a clean towel.  10.  Wear clean pajamas.            11.  Place clean sheets on your bed the night of your first shower and do not  sleep with pets. Day of Surgery : Do not apply any lotions/deodorants the morning of surgery.  Please wear clean clothes to the hospital/surgery center.  FAILURE TO FOLLOW THESE INSTRUCTIONS MAY RESULT IN THE CANCELLATION OF YOUR SURGERY PATIENT SIGNATURE_________________________________  NURSE SIGNATURE__________________________________  ________________________________________________________________________   Sylvia Hopkins  An incentive spirometer is a tool that can help keep your lungs clear and active. This tool  measures how well you are filling your lungs with each breath. Taking long deep breaths may help reverse or decrease the chance of developing breathing (pulmonary) problems (especially infection) following:  A long period of time when you are unable to move or be active. BEFORE THE PROCEDURE   If the spirometer includes an indicator to show your best effort, your nurse or respiratory therapist will set it to a desired goal.  If possible, sit up straight or lean slightly forward. Try not to slouch.  Hold the incentive spirometer in an upright position. INSTRUCTIONS FOR USE  1. Sit on the edge of your bed if possible, or sit up as far as you can in bed or on a chair. 2. Hold the incentive spirometer in an upright position. 3. Breathe out normally. 4. Place the mouthpiece in your mouth and seal your lips tightly around it. 5. Breathe in slowly and as deeply as possible, raising the piston or the ball toward the top of the column. 6. Hold your breath for 3-5 seconds or for as long as possible. Allow the piston or ball to fall to the bottom of the column. 7. Remove the mouthpiece from your mouth and breathe out normally. 8. Rest for a few seconds and repeat Steps 1 through 7 at least 10 times every 1-2 hours when you are awake. Take your time and take a few normal breaths between deep breaths. 9. The spirometer may include an indicator to show your best effort. Use the indicator as a goal to work toward during each repetition. 10. After each set of 10 deep breaths, practice coughing to be sure your lungs are clear. If you have an incision (the cut made at the time of surgery), support your incision when coughing by placing a pillow or rolled up towels firmly against it. Once you are able to get out of bed, walk around indoors and cough well. You may stop using the incentive spirometer when instructed by your caregiver.  RISKS AND COMPLICATIONS  Take your time so you do not get dizzy or  light-headed.  If you are in pain, you may need to take or ask for pain medication before doing incentive spirometry. It is harder to take a deep breath if you are having pain. AFTER USE  Rest and breathe slowly and easily.  It can be helpful to keep track of a log of your progress. Your caregiver can provide you with a simple table to help with this. If you are using the spirometer at home, follow these instructions: [place] IF:   You are having difficultly using the spirometer.  You have trouble using the spirometer as often as instructed.  Your pain medication is not giving enough relief while using the spirometer.  You develop fever of 100.5 F (38.1 C) or higher. SEEK IMMEDIATE MEDICAL CARE IF:   You cough up bloody  sputum that had not been present before.  You develop fever of 102 F (38.9 C) or greater.  You develop worsening pain at or near the incision site. MAKE SURE YOU:   Understand these instructions.  Will watch your condition.  Will get help right away if you are not doing well or get worse. Document Released: 11/05/2006 Document Revised: 09/17/2011 Document Reviewed: 01/06/2007 [facility] Patient Information 2014 [place], [place].   ________________________________________________________________________

## 2020-11-25 ENCOUNTER — Other Ambulatory Visit: Payer: Self-pay

## 2020-11-25 ENCOUNTER — Encounter (HOSPITAL_COMMUNITY): Payer: Self-pay

## 2020-11-25 ENCOUNTER — Encounter (HOSPITAL_COMMUNITY)
Admission: RE | Admit: 2020-11-25 | Discharge: 2020-11-25 | Disposition: A | Discharge status: Home / Self Care | Payer: Medicare Other | Source: Ambulatory Visit | Attending: Orthopedic Surgery | Admitting: Orthopedic Surgery

## 2020-11-25 DIAGNOSIS — Z01818 Encounter for other preprocedural examination: Secondary | ICD-10-CM | POA: Insufficient documentation

## 2020-11-25 HISTORY — DX: Personal history of other diseases of the digestive system: Z87.19

## 2020-11-25 HISTORY — DX: Headache, unspecified: R51.9

## 2020-11-25 HISTORY — DX: Unspecified osteoarthritis, unspecified site: M19.90

## 2020-11-25 LAB — BASIC METABOLIC PANEL
Anion gap: 10 (ref 5–15)
BUN: 12 mg/dL (ref 8–23)
CO2: 26 mmol/L (ref 22–32)
Calcium: 10.2 mg/dL (ref 8.9–10.3)
Chloride: 100 mmol/L (ref 98–111)
Creatinine, Ser: 0.57 mg/dL (ref 0.44–1.00)
GFR, Estimated: >60 mL/min (ref >60)
Glucose, Bld: 106 mg/dL — ABNORMAL HIGH (ref 70–99)
Potassium: 4.1 mmol/L (ref 3.5–5.1)
Sodium: 136 mmol/L (ref 135–145)

## 2020-11-25 LAB — CBC
HCT: 44.8 % (ref 36.0–46.0)
Hemoglobin: 15.6 g/dL — ABNORMAL HIGH (ref 12.0–15.0)
MCH: 32.5 pg (ref 26.0–34.0)
MCHC: 34.8 g/dL (ref 30.0–36.0)
MCV: 93.3 fL (ref 80.0–100.0)
Platelets: 312 10*3/uL (ref 150–400)
RBC: 4.8 MIL/uL (ref 3.87–5.11)
RDW: 13.1 % (ref 11.5–15.5)
WBC: 6.4 10*3/uL (ref 4.0–10.5)
nRBC: 0 % (ref 0.0–0.2)

## 2020-11-25 LAB — SURGICAL PCR SCREEN
MRSA, PCR: NEGATIVE
Staphylococcus aureus: NEGATIVE

## 2020-11-25 NOTE — Progress Notes (Signed)
COVID Vaccine Completed:Yes Date COVID Vaccine completed:09/07/19-booster 04/07/20, 11/16/20 COVID vaccine manufacturer: Pfizer    PCP - Dr. Cipriano [place] Cardiologist - none  Chest x-ray - no EKG - 11/25/20-chat, epic Stress Test - no ECHO - noCardiac Cath - NA Pacemaker/ICD device last checked:NA  Sleep Study - no CPAP -   Fasting Blood Sugar - NA Checks Blood Sugar _____ times a day  Blood Thinner Instructions:NA Aspirin Instructions: Last Dose:  Anesthesia review:   Patient denies shortness of breath, fever, cough and chest pain at PAT appointment Yes. Pt is able to climb 2 flights of stairs, do housework and ADLs with out SOB if she has used her inhalers and if it isn't seasonal allergy time.  Patient verbalized understanding of instructions that were given to them at the PAT appointment. Patient was also instructed that they will need to review over the PAT instructions again at home before surgery.yes Pt is very anxious about surgery. And she had ADHD.

## 2020-12-08 ENCOUNTER — Ambulatory Visit (HOSPITAL_COMMUNITY): Payer: Medicare Other | Admitting: Emergency Medicine

## 2020-12-08 ENCOUNTER — Ambulatory Visit (HOSPITAL_COMMUNITY): Payer: Medicare Other | Admitting: Certified Registered Nurse Anesthetist

## 2020-12-08 ENCOUNTER — Encounter (HOSPITAL_COMMUNITY)
Admission: RE | Disposition: A | Discharge status: Home / Self Care | Payer: Self-pay | Source: Home / Self Care | Attending: Orthopedic Surgery

## 2020-12-08 ENCOUNTER — Ambulatory Visit (HOSPITAL_COMMUNITY)
Admission: RE | Admit: 2020-12-08 | Discharge: 2020-12-08 | Disposition: A | Discharge status: Home / Self Care | Payer: Medicare Other | Attending: Orthopedic Surgery | Admitting: Orthopedic Surgery

## 2020-12-08 ENCOUNTER — Encounter (HOSPITAL_COMMUNITY): Payer: Self-pay | Admitting: Orthopedic Surgery

## 2020-12-08 DIAGNOSIS — Z79899 Other long term (current) drug therapy: Secondary | ICD-10-CM | POA: Diagnosis not present

## 2020-12-08 DIAGNOSIS — Z7951 Long term (current) use of inhaled steroids: Secondary | ICD-10-CM | POA: Insufficient documentation

## 2020-12-08 DIAGNOSIS — M19011 Primary osteoarthritis, right shoulder: Secondary | ICD-10-CM | POA: Diagnosis not present

## 2020-12-08 DIAGNOSIS — M25711 Osteophyte, right shoulder: Secondary | ICD-10-CM | POA: Insufficient documentation

## 2020-12-08 HISTORY — PX: TOTAL SHOULDER ARTHROPLASTY: SHX126

## 2020-12-08 SURGERY — ARTHROPLASTY, SHOULDER, TOTAL
Anesthesia: Regional | Site: Shoulder | Laterality: Right

## 2020-12-08 MED ORDER — PROPOFOL 10 MG/ML IV BOLUS
INTRAVENOUS | Status: AC
  Filled 2020-12-08: qty 20

## 2020-12-08 MED ORDER — BUPIVACAINE LIPOSOME 1.3 % IJ SUSP
INTRAMUSCULAR | Status: DC | PRN
  Administered 2020-12-08: 10 mL via PERINEURAL

## 2020-12-08 MED ORDER — 0.9 % SODIUM CHLORIDE (POUR BTL) OPTIME
TOPICAL | Status: DC | PRN
  Administered 2020-12-08: 1000 mL

## 2020-12-08 MED ORDER — FENTANYL CITRATE (PF) 100 MCG/2ML IJ SOLN: 25.0000 ug | INTRAMUSCULAR | Status: DC | PRN

## 2020-12-08 MED ORDER — LIDOCAINE 2% (20 MG/ML) 5 ML SYRINGE
INTRAMUSCULAR | Status: DC | PRN
  Administered 2020-12-08: 60 mg via INTRAVENOUS

## 2020-12-08 MED ORDER — OXYCODONE HCL 5 MG PO TABS: 5.0000 mg | ORAL_TABLET | Freq: Once | ORAL | Status: AC

## 2020-12-08 MED ORDER — PROPOFOL 10 MG/ML IV BOLUS
INTRAVENOUS | Status: DC | PRN
  Administered 2020-12-08: 130 mg via INTRAVENOUS

## 2020-12-08 MED ORDER — SCOPOLAMINE 1 MG/3DAYS TD PT72
1.0000 | MEDICATED_PATCH | TRANSDERMAL | Status: DC
  Administered 2020-12-08: 1.5 mg via TRANSDERMAL

## 2020-12-08 MED ORDER — ORAL CARE MOUTH RINSE: 15.0000 mL | Freq: Once | OROMUCOSAL | Status: AC

## 2020-12-08 MED ORDER — PHENYLEPHRINE 40 MCG/ML (10ML) SYRINGE FOR IV PUSH (FOR BLOOD PRESSURE SUPPORT)
PREFILLED_SYRINGE | INTRAVENOUS | Status: AC
  Filled 2020-12-08: qty 10

## 2020-12-08 MED ORDER — VANCOMYCIN HCL 1000 MG IV SOLR
INTRAVENOUS | Status: DC | PRN
  Administered 2020-12-08: 1000 mg via TOPICAL

## 2020-12-08 MED ORDER — SCOPOLAMINE 1 MG/3DAYS TD PT72
MEDICATED_PATCH | TRANSDERMAL | Status: AC
  Filled 2020-12-08: qty 1

## 2020-12-08 MED ORDER — DEXAMETHASONE SODIUM PHOSPHATE 4 MG/ML IJ SOLN
INTRAMUSCULAR | Status: DC | PRN
  Administered 2020-12-08: 10 mg via INTRAVENOUS

## 2020-12-08 MED ORDER — OXYCODONE-ACETAMINOPHEN 5-325 MG PO TABS: 1.0000 | ORAL_TABLET | ORAL | 0 refills | Status: DC | PRN

## 2020-12-08 MED ORDER — FENTANYL CITRATE (PF) 100 MCG/2ML IJ SOLN
INTRAMUSCULAR | Status: AC
  Filled 2020-12-08: qty 2

## 2020-12-08 MED ORDER — ONDANSETRON HCL 4 MG PO TABS: 4.0000 mg | ORAL_TABLET | Freq: Three times a day (TID) | ORAL | 0 refills | Status: DC | PRN

## 2020-12-08 MED ORDER — SUGAMMADEX SODIUM 200 MG/2ML IV SOLN
INTRAVENOUS | Status: DC | PRN
  Administered 2020-12-08: 200 mg via INTRAVENOUS

## 2020-12-08 MED ORDER — LACTATED RINGERS IV BOLUS
250.0000 mL | Freq: Once | INTRAVENOUS | Status: AC
  Administered 2020-12-08: 250 mL via INTRAVENOUS

## 2020-12-08 MED ORDER — FENTANYL CITRATE (PF) 100 MCG/2ML IJ SOLN
50.0000 ug | INTRAMUSCULAR | Status: DC
  Administered 2020-12-08: 100 ug via INTRAVENOUS
  Administered 2020-12-08: 50 ug via INTRAVENOUS
  Filled 2020-12-08: qty 2

## 2020-12-08 MED ORDER — CEFAZOLIN SODIUM-DEXTROSE 2-4 GM/100ML-% IV SOLN
2.0000 g | INTRAVENOUS | Status: AC
  Administered 2020-12-08: 2 g via INTRAVENOUS
  Filled 2020-12-08: qty 100

## 2020-12-08 MED ORDER — CHLORHEXIDINE GLUCONATE 0.12 % MT SOLN
15.0000 mL | Freq: Once | OROMUCOSAL | Status: AC
  Administered 2020-12-08: 15 mL via OROMUCOSAL

## 2020-12-08 MED ORDER — PHENYLEPHRINE 40 MCG/ML (10ML) SYRINGE FOR IV PUSH (FOR BLOOD PRESSURE SUPPORT)
PREFILLED_SYRINGE | INTRAVENOUS | Status: DC | PRN
  Administered 2020-12-08: 120 ug via INTRAVENOUS
  Administered 2020-12-08: 80 ug via INTRAVENOUS
  Administered 2020-12-08: 120 ug via INTRAVENOUS

## 2020-12-08 MED ORDER — LIDOCAINE 2% (20 MG/ML) 5 ML SYRINGE
INTRAMUSCULAR | Status: AC
  Filled 2020-12-08: qty 5

## 2020-12-08 MED ORDER — PHENYLEPHRINE HCL-NACL 10-0.9 MG/250ML-% IV SOLN
INTRAVENOUS | Status: AC
  Filled 2020-12-08: qty 250

## 2020-12-08 MED ORDER — ONDANSETRON HCL 4 MG/2ML IJ SOLN: 4.0000 mg | Freq: Four times a day (QID) | INTRAMUSCULAR | Status: DC | PRN

## 2020-12-08 MED ORDER — LACTATED RINGERS IV BOLUS
500.0000 mL | Freq: Once | INTRAVENOUS | Status: AC
  Administered 2020-12-08: 500 mL via INTRAVENOUS

## 2020-12-08 MED ORDER — OXYCODONE HCL 5 MG PO TABS
ORAL_TABLET | ORAL | Status: AC
  Administered 2020-12-08: 5 mg via ORAL
  Filled 2020-12-08: qty 1

## 2020-12-08 MED ORDER — ROCURONIUM BROMIDE 10 MG/ML (PF) SYRINGE
PREFILLED_SYRINGE | INTRAVENOUS | Status: AC
  Filled 2020-12-08: qty 10

## 2020-12-08 MED ORDER — ACETAMINOPHEN 500 MG PO TABS
1000.0000 mg | ORAL_TABLET | Freq: Once | ORAL | Status: AC
  Administered 2020-12-08: 1000 mg via ORAL
  Filled 2020-12-08: qty 2

## 2020-12-08 MED ORDER — DEXAMETHASONE SODIUM PHOSPHATE 10 MG/ML IJ SOLN
INTRAMUSCULAR | Status: AC
  Filled 2020-12-08: qty 1

## 2020-12-08 MED ORDER — CYCLOBENZAPRINE HCL 10 MG PO TABS: 10.0000 mg | ORAL_TABLET | Freq: Three times a day (TID) | ORAL | 1 refills | Status: DC | PRN

## 2020-12-08 MED ORDER — APREPITANT 40 MG PO CAPS
40.0000 mg | ORAL_CAPSULE | Freq: Once | ORAL | Status: AC
  Administered 2020-12-08: 40 mg via ORAL
  Filled 2020-12-08: qty 1

## 2020-12-08 MED ORDER — ROCURONIUM BROMIDE 10 MG/ML (PF) SYRINGE
PREFILLED_SYRINGE | INTRAVENOUS | Status: DC | PRN
  Administered 2020-12-08: 100 mg via INTRAVENOUS

## 2020-12-08 MED ORDER — ONDANSETRON HCL 4 MG/2ML IJ SOLN
INTRAMUSCULAR | Status: AC
  Filled 2020-12-08: qty 2

## 2020-12-08 MED ORDER — MIDAZOLAM HCL 2 MG/2ML IJ SOLN
1.0000 mg | INTRAMUSCULAR | Status: DC
  Administered 2020-12-08: 1 mg via INTRAVENOUS
  Filled 2020-12-08: qty 2

## 2020-12-08 MED ORDER — TRANEXAMIC ACID-NACL 1000-0.7 MG/100ML-% IV SOLN
1000.0000 mg | INTRAVENOUS | Status: AC
  Administered 2020-12-08: 1000 mg via INTRAVENOUS
  Filled 2020-12-08: qty 100

## 2020-12-08 MED ORDER — ONDANSETRON HCL 4 MG/2ML IJ SOLN
INTRAMUSCULAR | Status: DC | PRN
  Administered 2020-12-08: 4 mg via INTRAVENOUS

## 2020-12-08 MED ORDER — LACTATED RINGERS IV SOLN: INTRAVENOUS | Status: DC

## 2020-12-08 MED ORDER — VANCOMYCIN HCL 1000 MG IV SOLR
INTRAVENOUS | Status: AC
  Filled 2020-12-08: qty 1000

## 2020-12-08 MED ORDER — ONDANSETRON HCL 4 MG PO TABS
4.0000 mg | ORAL_TABLET | Freq: Four times a day (QID) | ORAL | Status: DC | PRN
  Filled 2020-12-08: qty 1

## 2020-12-08 MED ORDER — BUPIVACAINE HCL (PF) 0.5 % IJ SOLN
INTRAMUSCULAR | Status: DC | PRN
  Administered 2020-12-08: 15 mL via PERINEURAL

## 2020-12-08 MED ORDER — METOCLOPRAMIDE HCL 5 MG PO TABS
5.0000 mg | ORAL_TABLET | Freq: Three times a day (TID) | ORAL | Status: DC | PRN
Start: 2020-12-08 — End: 2020-12-08
  Filled 2020-12-08: qty 2

## 2020-12-08 MED ORDER — STERILE WATER FOR IRRIGATION IR SOLN
Status: DC | PRN
  Administered 2020-12-08: 2000 mL

## 2020-12-08 MED ORDER — METOCLOPRAMIDE HCL 5 MG/ML IJ SOLN: 5.0000 mg | Freq: Three times a day (TID) | INTRAMUSCULAR | Status: DC | PRN

## 2020-12-08 MED ORDER — PHENYLEPHRINE HCL-NACL 10-0.9 MG/250ML-% IV SOLN
INTRAVENOUS | Status: DC | PRN
  Administered 2020-12-08: 50 ug/min via INTRAVENOUS

## 2020-12-08 SURGICAL SUPPLY — 64 items
BAG ZIPLOCK 12X15 (MISCELLANEOUS) ×2 IMPLANT
BIT DRILL 2.0X128 (BIT) ×2 IMPLANT
BLADE SAW SGTL 83.5X18.5 (BLADE) ×2 IMPLANT
BODY TRUNION ECLIPSE 43 SL (Shoulder) ×2 IMPLANT
CEMENT BONE DEPUY (Cement) ×4 IMPLANT
COOLER ICEMAN CLASSIC (MISCELLANEOUS) ×2 IMPLANT
COVER BACK TABLE 60X90IN (DRAPES) ×2 IMPLANT
COVER SURGICAL LIGHT HANDLE (MISCELLANEOUS) ×2 IMPLANT
COVER WAND RF STERILE (DRAPES) IMPLANT
DERMABOND ADVANCED (GAUZE/BANDAGES/DRESSINGS) ×1
DERMABOND ADVANCED .7 DNX12 (GAUZE/BANDAGES/DRESSINGS) ×1 IMPLANT
DRAPE ORTHO SPLIT 77X108 STRL (DRAPES) ×4
DRAPE SHEET LG 3/4 BI-LAMINATE (DRAPES) ×2 IMPLANT
DRAPE SURG 17X11 SM STRL (DRAPES) ×2 IMPLANT
DRAPE SURG ORHT 6 SPLT 77X108 (DRAPES) ×2 IMPLANT
DRAPE U-SHAPE 47X51 STRL (DRAPES) ×2 IMPLANT
DRSG AQUACEL AG ADV 3.5X 6 (GAUZE/BANDAGES/DRESSINGS) ×2 IMPLANT
DRSG AQUACEL AG ADV 3.5X10 (GAUZE/BANDAGES/DRESSINGS) IMPLANT
DURAPREP 26ML APPLICATOR (WOUND CARE) ×2 IMPLANT
ELECT BLADE TIP CTD 4 INCH (ELECTRODE) ×2 IMPLANT
ELECT REM PT RETURN 15FT ADLT (MISCELLANEOUS) ×2 IMPLANT
FACESHIELD WRAPAROUND (MASK) ×10 IMPLANT
GLENOID WITH CLEAT MEDIUM (Shoulder) ×2 IMPLANT
GLOVE SRG 8 PF TXTR STRL LF DI (GLOVE) ×1 IMPLANT
GLOVE SURG ENC MOIS LTX SZ7 (GLOVE) ×2 IMPLANT
GLOVE SURG ENC MOIS LTX SZ7.5 (GLOVE) ×2 IMPLANT
GLOVE SURG ENC MOIS LTX SZ8 (GLOVE) ×2 IMPLANT
GLOVE SURG UNDER POLY LF SZ7 (GLOVE) ×2 IMPLANT
GLOVE SURG UNDER POLY LF SZ8 (GLOVE) ×2
GOWN STRL REUS W/TWL LRG LVL3 (GOWN DISPOSABLE) ×4 IMPLANT
HEAD HUMERAL ECLIPSE 45/17 (Shoulder) ×2 IMPLANT
IMPL ECLIPSE SPEEDCAP (Shoulder) ×1 IMPLANT
IMPLANT ECLIPSE SPEEDCAP (Shoulder) ×2 IMPLANT
KIT BASIN OR (CUSTOM PROCEDURE TRAY) ×2 IMPLANT
KIT TURNOVER KIT A (KITS) ×2 IMPLANT
MANIFOLD NEPTUNE II (INSTRUMENTS) ×2 IMPLANT
NEEDLE TAPERED W/ NITINOL LOOP (MISCELLANEOUS) IMPLANT
NS IRRIG 1000ML POUR BTL (IV SOLUTION) ×2 IMPLANT
PACK SHOULDER (CUSTOM PROCEDURE TRAY) ×2 IMPLANT
PAD COLD SHLDR WRAP-ON (PAD) ×2 IMPLANT
PIN NITINOL TARGETER 2.8 (PIN) IMPLANT
PIN SET MODULAR GLENOID SYSTEM (PIN) ×2 IMPLANT
PROTECTOR NERVE ULNAR (MISCELLANEOUS) IMPLANT
RESTRAINT HEAD UNIVERSAL NS (MISCELLANEOUS) ×2 IMPLANT
SCREW MED ECLIPSE 35 (Screw) ×2 IMPLANT
SIZER ECLIPSE CAGE SCREW (ORTHOPEDIC DISPOSABLE SUPPLIES) ×2 IMPLANT
SLING ARM FOAM STRAP LRG (SOFTGOODS) ×2 IMPLANT
SLING ARM FOAM STRAP MED (SOFTGOODS) IMPLANT
SMARTMIX MINI TOWER (MISCELLANEOUS) ×2
SPONGE LAP 18X18 RF (DISPOSABLE) ×2 IMPLANT
SUCTION FRAZIER HANDLE 12FR (TUBING) ×2
SUCTION TUBE FRAZIER 12FR DISP (TUBING) ×1 IMPLANT
SUT FIBERWIRE #2 38 T-5 BLUE (SUTURE)
SUT MNCRL AB 3-0 PS2 18 (SUTURE) ×2 IMPLANT
SUT MON AB 2-0 CT1 36 (SUTURE) ×2 IMPLANT
SUT VIC AB 1 CT1 36 (SUTURE) ×2 IMPLANT
SUTURE FIBERWR #2 38 T-5 BLUE (SUTURE) IMPLANT
SUTURE TAPE 1.3 40 TPR END (SUTURE) IMPLANT
SUTURETAPE 1.3 40 TPR END (SUTURE)
TOWEL OR 17X26 10 PK STRL BLUE (TOWEL DISPOSABLE) ×2 IMPLANT
TOWEL OR NON WOVEN STRL DISP B (DISPOSABLE) ×2 IMPLANT
TOWER SMARTMIX MINI (MISCELLANEOUS) ×1 IMPLANT
WATER STERILE IRR 1000ML POUR (IV SOLUTION) ×4 IMPLANT
YANKAUER SUCT BULB TIP 10FT TU (MISCELLANEOUS) ×2 IMPLANT

## 2020-12-08 NOTE — Anesthesia Postprocedure Evaluation (Signed)
Anesthesia Post Note  Patient: Sylvia Hopkins  Procedure(s) Performed: TOTAL SHOULDER ARTHROPLASTY (Right Shoulder)     Patient location during evaluation: PACU Anesthesia Type: Regional and General Level of consciousness: awake and alert Pain management: pain level controlled Vital Signs Assessment: post-procedure vital signs reviewed and stable Respiratory status: spontaneous breathing, nonlabored ventilation, respiratory function stable and patient connected to nasal cannula oxygen Cardiovascular status: blood pressure returned to baseline and stable Postop Assessment: no apparent nausea or vomiting Anesthetic complications: no   No complications documented.  Last Vitals:  Vitals:   12/08/20 1245 12/08/20 1300  BP: 134/84 136/88  Pulse: 71 70  Resp: 14 18  Temp:  36.6 C  SpO2: 90% 93%    Last Pain:  Vitals:   12/08/20 1300  TempSrc:   PainSc: Asleep                 [name]

## 2020-12-08 NOTE — Anesthesia Procedure Notes (Signed)
Procedure Name: Intubation Date/Time: 12/08/2020 10:30 AM Performed by: Claudia Desanctis, CRNA Pre-anesthesia Checklist: Patient identified, Emergency Drugs available, Suction available and Patient being monitored Patient Re-evaluated:Patient Re-evaluated prior to induction Oxygen Delivery Method: Circle system utilized Preoxygenation: Pre-oxygenation with 100% oxygen Induction Type: IV induction Ventilation: Mask ventilation without difficulty Laryngoscope Size: 2 and [place] Grade View: Grade I Tube type: Oral Number of attempts: 1 Airway Equipment and Method: Stylet Placement Confirmation: ETT inserted through vocal cords under direct vision,  positive ETCO2 and breath sounds checked- equal and bilateral Tube secured with: Tape Dental Injury: Teeth and Oropharynx as per pre-operative assessment

## 2020-12-08 NOTE — Progress Notes (Signed)
Assisted Dr. Chelsey Woodrum with right, ultrasound guided, interscalene  block. Side rails up, monitors on throughout procedure. See vital signs in flow sheet. Tolerated Procedure well.  

## 2020-12-08 NOTE — Evaluation (Signed)
Occupational Therapy Evaluation Patient Details Name: Sylvia Hopkins MRN: [id number] DOB: 16-Mar-1956 Today's Date: 12/08/2020    History of Present Illness Patient s/p R TSA   Clinical Impression   Mrs. Shequita Peplinski is a 65 year old woman s/p shoulder replacement without functional use of right dominant upper extremity secondary to effects of surgery and interscalene block and shoulder precautions. Therapist provided education and instruction to patient and spouse in regards to exercises, precautions, positioning, donning upper extremity clothing and bathing while maintaining shoulder precautions, ice and edema management via cuff and cooler and donning/doffing sling. Patient to follow up with MD for further therapy needs.      Follow Up Recommendations  Follow surgeon's recommendation for DC plan and follow-up therapies    Equipment Recommendations  None recommended by OT    Recommendations for Other Services       Precautions / Restrictions Precautions Precautions: Shoulder Type of Shoulder Precautions: May come out of sling if sitting in controlled environment. ie while watching tv, eating etc to give neck and skin break from sling. Please sleep in sling though until 4 weeks post op.     Ok to use operative arm to assist in feeding, bathing, ADL's.       New PROM restrictions (8/18) for use in hygiene and ADL only   ER 20   ABD 45   FE 60     Pendulums are to be gentle and are the preferred exercise to be instructed for patients to perform at home.( Along with elbow wrist and hand exercise) Shoulder Interventions: Shoulder sling/immobilizer;At all times;Off for dressing/bathing/exercises Precaution Booklet Issued: No (handouts provided) Restrictions Weight Bearing Restrictions: Yes RUE Weight Bearing: Non weight bearing      Mobility Bed Mobility                    Transfers                      Balance Overall balance assessment: No apparent balance  deficits (not formally assessed)                                         ADL either performed or assessed with clinical judgement   ADL Overall ADL's : Needs assistance/impaired Eating/Feeding: Independent   Grooming: Modified independent   Upper Body Bathing: Adhering to UE precautions;Minimal assistance   Lower Body Bathing: Modified independent   Upper Body Dressing : Moderate assistance;Adhering to UE precautions   Lower Body Dressing: Modified independent   Toilet Transfer: Modified Independent   Toileting- Clothing Manipulation and Hygiene: Modified independent               Vision Patient Visual Report: No change from baseline       Perception     Praxis      Pertinent Vitals/Pain Pain Assessment: No/denies pain (secondary to block)     Hand Dominance     Extremity/Trunk Assessment Upper Extremity Assessment Upper Extremity Assessment: RUE deficits/detail RUE Deficits / Details: impaired AROM due to interscalene block, limited by shoulder precautions   Lower Extremity Assessment Lower Extremity Assessment: Overall WFL for tasks assessed   Cervical / Trunk Assessment Cervical / Trunk Assessment: Normal   Communication     Cognition Arousal/Alertness: Awake/alert Behavior During Therapy: WFL for tasks assessed/performed Overall Cognitive Status: Within Functional Limits for tasks assessed  General Comments       Exercises     Shoulder Instructions Shoulder Instructions Donning/doffing shirt without moving shoulder: Caregiver independent with task Method for sponge bathing under operated UE: Caregiver independent with task Donning/doffing sling/immobilizer: Caregiver independent with task Correct positioning of sling/immobilizer: Caregiver independent with task Pendulum exercises (written home exercise program): Caregiver independent with task ROM for elbow, wrist and digits  of operated UE: Caregiver independent with task Sling wearing schedule (on at all times/off for ADL's): Caregiver independent with task Proper positioning of operated UE when showering: Caregiver independent with task Dressing change: Caregiver independent with task Positioning of UE while sleeping: Caregiver independent with task    Home Living Family/patient expects to be discharged to:: Private residence Living Arrangements: Spouse/significant other                                      Prior Functioning/Environment                   OT Problem List: Decreased strength;Decreased range of motion;Impaired UE functional use;Pain      OT Treatment/Interventions:      OT Goals(Current goals can be found in the care plan section) Acute Rehab OT Goals OT Goal Formulation: All assessment and education complete, DC therapy  OT Frequency:     Barriers to D/C:            Co-evaluation              AM-PAC OT "6 Clicks" Daily Activity     Outcome Measure Help from another person eating meals?: None Help from another person taking care of personal grooming?: None Help from another person toileting, which includes using toliet, bedpan, or urinal?: None Help from another person bathing (including washing, rinsing, drying)?: A Little Help from another person to put on and taking off regular upper body clothing?: A Lot Help from another person to put on and taking off regular lower body clothing?: None 6 Click Score: 21   End of Session Nurse Communication:  (OT education)  Activity Tolerance: Patient tolerated treatment well Patient left: in chair  OT Visit Diagnosis: Pain Pain - Right/Left: Right Pain - part of body: Shoulder                Time: 7793-9030 OT Time Calculation (min): 24 min Charges:  OT General Charges $OT Visit: 1 Visit OT Evaluation $OT Eval Low Complexity: 1 Low OT Treatments $Self Care/Home Management : 8-22 mins  Alpha Mysliwiec,  OTR/L [place]  Office [phone] Pager: [phone]   Lenward Chancellor 12/08/2020, 2:06 PM

## 2020-12-08 NOTE — Anesthesia Procedure Notes (Signed)
Anesthesia Regional Block: Interscalene brachial plexus block   Pre-Anesthetic Checklist: ,, timeout performed, Correct Patient, Correct Site, Correct Laterality, Correct Procedure, Correct Position, site marked, Risks and benefits discussed,  Surgical consent,  Pre-op evaluation,  At surgeon's request and post-op pain management  Laterality: Right  Prep: Maximum Sterile Barrier Precautions used, chloraprep       Needles:  Injection technique: Single-shot  Needle Type: Echogenic Stimulator Needle     Needle Length: 4cm  Needle Gauge: 22     Additional Needles:   Procedures:,,,, ultrasound used (permanent image in chart),,,,  Narrative:  Start time: 12/08/2020 9:10 AM End time: 12/08/2020 9:17 AM Injection made incrementally with aspirations every 5 mL.  Performed by: Personally  Anesthesiologist: Freddrick March, MD  Additional Notes: Monitors applied. No increased pain on injection. No increased resistance to injection. Injection made in 5cc increments. Good needle visualization. Patient tolerated procedure well.

## 2020-12-08 NOTE — Anesthesia Preprocedure Evaluation (Addendum)
Anesthesia Evaluation  Patient identified by MRN, date of birth, ID band Patient awake    Reviewed: Allergy & Precautions, NPO status , Patient's Chart, lab work & pertinent test results  Airway Mallampati: I  TM Distance: >3 FB Neck ROM: Full    Dental no notable dental hx. (+) Teeth Intact, Dental Advisory Given   Pulmonary asthma ,    Pulmonary exam normal breath sounds clear to auscultation       Cardiovascular hypertension, Normal cardiovascular exam Rhythm:Regular Rate:Normal     Neuro/Psych  Headaches, PSYCHIATRIC DISORDERS Anxiety Depression    GI/Hepatic Neg liver ROS, hiatal hernia, GERD  Medicated and Controlled,  Endo/Other  Hypothyroidism   Renal/GU negative Renal ROS  negative genitourinary   Musculoskeletal  (+) Arthritis ,   Abdominal   Peds  (+) ADHD Hematology negative hematology ROS (+)   Anesthesia Other Findings   Reproductive/Obstetrics                            Anesthesia Physical Anesthesia Plan  ASA: III  Anesthesia Plan: General and Regional   Post-op Pain Management:  Regional for Post-op pain   Induction: Intravenous  PONV Risk Score and Plan: 3 and Midazolam, Dexamethasone and Ondansetron  Airway Management Planned: Oral ETT  Additional Equipment:   Intra-op Plan:   Post-operative Plan: Extubation in OR  Informed Consent: I have reviewed the patients History and Physical, chart, labs and discussed the procedure including the risks, benefits and alternatives for the proposed anesthesia with the patient or authorized representative who has indicated his/her understanding and acceptance.     Dental advisory given  Plan Discussed with: CRNA  Anesthesia Plan Comments:         Anesthesia Quick Evaluation

## 2020-12-08 NOTE — Transfer of Care (Signed)
Immediate Anesthesia Transfer of Care Note  Patient: Sylvia Hopkins  Procedure(s) Performed: TOTAL SHOULDER ARTHROPLASTY (Right Shoulder)  Patient Location: PACU  Anesthesia Type:GA combined with regional for post-op pain  Level of Consciousness: awake and patient cooperative  Airway & Oxygen Therapy: Patient Spontanous Breathing and Patient connected to face mask  Post-op Assessment: Report given to RN and Post -op Vital signs reviewed and stable  Post vital signs: Reviewed and stable  Last Vitals:  Vitals Value Taken Time  BP 143/78 12/08/20 1212  Temp    Pulse 75 12/08/20 1214  Resp 13 12/08/20 1214  SpO2 98 % 12/08/20 1214  Vitals shown include unvalidated device data.  Last Pain:  Vitals:   12/08/20 0804  TempSrc:   PainSc: 0-No pain      Patients Stated Pain Goal: 5 (71/83/67 2550)  Complications: No complications documented.

## 2020-12-08 NOTE — H&P (Signed)
Sylvia Hopkins    Chief Complaint: right shoulder osteoarthritis HPI: The patient is a 65 y.o. female with chronic and progressively increasing right shoulder pain related to severe glenohumeral joint osteoarthritis.  Due to her increasing functional imitations and failure to respond to prolonged attempts at conservative management, she is brought to the operating room at this time for planned right shoulder anatomic arthroplasty  Past Medical History:  Diagnosis Date  . ADHD   . Alcohol use   . Anxiety   . Arthritis    sholders. back, lt knee  . Asthma due to environmental allergies    inhalers 2x a day  . Back pain   . Constipation   . Depression   . Diverticula of colon   . Diverticulitis 2016  . Fatigue   . GERD (gastroesophageal reflux disease)   . Hay fever   . Headache    migraines with allergies  . History of hiatal hernia   . HTN (hypertension)    no meds. watching  . Joint pain   . Seasonal allergies   . Shortness of breath on exertion   . Swelling of extremity   . Thyroid disease   . Vertigo 2022  . Vision changes   . Vitamin D deficiency     Past Surgical History:  Procedure Laterality Date  . CESAREAN SECTION     x2  . COLONOSCOPY    . HAND SURGERY    . TONSILLECTOMY      Family History  Problem Relation Age of Onset  . Emphysema Mother   . Depression Mother   . Anxiety disorder Mother   . Alcoholism Mother   . Prostate cancer Father   . Heart disease Father   . Hyperlipidemia Father   . Hypertension Father   . Cancer Father   . Hyperlipidemia Sister   . Heart disease Brother   . Hypertension Brother   . Hepatitis C Child     Social History:  reports that she has never smoked. She has never used smokeless tobacco. She reports current alcohol use. She reports that she does not use drugs.   Medications Prior to Admission  Medication Sig Dispense Refill  . acetaminophen (TYLENOL) 500 MG tablet Take 1,000 mg by mouth daily as needed for  pain.    Marland Kitchen albuterol (PROVENTIL HFA;VENTOLIN HFA) 108 (90 BASE) MCG/ACT inhaler Inhale 2 puffs into the lungs every 6 (six) hours as needed for wheezing or shortness of breath.    . ALPRAZolam (XANAX) 0.5 MG tablet Take 0.5 mg by mouth daily as needed for anxiety.    Marland Kitchen amphetamine-dextroamphetamine (ADDERALL) 30 MG tablet Take 1 tablet by mouth 2 (two) times daily. (Patient taking differently: No sig reported) 60 tablet 0  . azelastine (ASTELIN) 0.1 % nasal spray Place 1 spray into both nostrils daily as needed for allergies. Use in each nostril as directed    . calcium elemental as carbonate (TUMS ULTRA 1000) 400 MG chewable tablet Chew 1,000 mg by mouth daily as needed for heartburn.    . Cholecalciferol (DIALYVITE VITAMIN D 5000) 125 MCG (5000 UT) capsule Take 5,000 Units by mouth daily.    Marland Kitchen Dextran 70-Hypromellose, PF, (GENTEAL TEARS PF) 0.1-0.3 % SOLN Place 1 drop into both eyes at bedtime.    . diphenhydrAMINE (BENADRYL) 25 MG tablet Take 25 mg by mouth every 6 (six) hours as needed for allergies or sleep.    Marland Kitchen DIVIGEL 1 MG/GM GEL Apply 1 mg topically daily.    Marland Kitchen  estradiol (CLIMARA - DOSED IN MG/24 HR) 0.025 mg/24hr patch Place 0.025 mg onto the skin 2 (two) times a week.    . estradiol (ESTRACE) 0.1 MG/GM vaginal cream Place 1 Applicatorful vaginally once a week.    . famotidine (PEPCID) 20 MG tablet Take 20 mg by mouth daily as needed (Acid reflux).    . fexofenadine (ALLEGRA) 60 MG tablet Take 60 mg by mouth 2 (two) times daily.    . fexofenadine-pseudoephedrine (ALLEGRA-D) 60-120 MG 12 hr tablet Take 1 tablet by mouth 2 (two) times daily.    . fluticasone (FLONASE) 50 MCG/ACT nasal spray Place 1 spray into both nostrils daily as needed for allergies.    . mometasone-formoterol (DULERA) 100-5 MCG/ACT AERO Inhale 2 puffs into the lungs 2 (two) times daily.    . Naphazoline-Pheniramine (OPCON-A) 0.027-0.315 % SOLN Place 1 drop into both eyes daily as needed (Allergies).    Marland Kitchen OVER THE  COUNTER MEDICATION Take 1-2 capsules by mouth as needed (Congestion/Inflammation). SUPER QUERCETIN    . progesterone (PROMETRIUM) 100 MG capsule Take 100 mg by mouth every other day.    . pseudoephedrine (SUDAFED) 30 MG tablet Take 30 mg by mouth every 6 (six) hours as needed for congestion.       Physical Exam: Right shoulder demonstrates painful and guarded motion as noted at recent office visits.  He is otherwise neurovascular intact with good strength testing of the rotator cuff sketcher.  Plain radiographs confirm severe osteoarthritis with complete loss of joint space, peripheral osteophyte formation, and subchondral sclerosis.  Patient also reports a history of a nickel allergy and so we anticipate utilizing a titanium implant  Vitals  Temp:  [99.5 F (37.5 C)] 99.5 F (37.5 C) (06/02 0753) Pulse Rate:  [91] 91 (06/02 0753) Resp:  [16] 16 (06/02 0753) BP: (139)/(89) 139/89 (06/02 0753) SpO2:  [99 %] 99 % (06/02 0753) Weight:  [90.7 kg] 90.7 kg (06/02 0804)  Assessment/Plan  Impression: right shoulder osteoarthritis  Plan of Action: Procedure(s): TOTAL SHOULDER ARTHROPLASTY  Sylvia Hopkins M Sylvia Hopkins 12/08/2020, 9:15 AM Contact # [phone]

## 2020-12-08 NOTE — Op Note (Signed)
12/08/2020  11:59 AM  PATIENT:   Sylvia Hopkins  65 y.o. female  PRE-OPERATIVE DIAGNOSIS:  right shoulder osteoarthritis  POST-OPERATIVE DIAGNOSIS: Same  PROCEDURE: Right shoulder stemless anatomic arthroplasty utilizing a size 43 trunnion, medium cage screw, 45 x 17 humeral head all titanium, medium glenoid  SURGEON:  Darielys Giglia, Metta Clines M.D.  ASSISTANTS: Jenetta Loges, PA-C  ANESTHESIA:   General endotracheal and interscalene block with Exparel  EBL: 150 cc  SPECIMEN: None  Drains: None   PATIENT DISPOSITION:  PACU - hemodynamically stable.    PLAN OF CARE: Discharge to home after PACU  Brief history:  [name] is a 64 year old female who has had chronic and progressively increasing right shoulder pain related to severe osteoarthritis.  Due to her increasing functional imitations and failure to respond to prolonged attempts at conservative management she is brought to the operating this time for planned right shoulder anatomic arthroplasty.  Preoperatively, I counseled the patient regarding treatment options and risks versus benefits thereof.  Possible surgical complications were all reviewed including potential for bleeding, infection, neurovascular injury, persistent pain, loss of motion, anesthetic complication, failure of the implant, and possible need for additional surgery. They understand and accept and agrees with our planned procedure.   Procedure in detail:  After undergoing routine preop evaluation the patient received prophylactic antibiotics and interscalene block with Exparel was established in the holding area by the anesthesia department.  Patient subsequently brought to the operating room and placed supine on the operating table where she underwent the smooth induction of a general endotracheal anesthesia.  Placed into the beachchair position and appropriately padded and protected.  Right shoulder girdle region was sterilely prepped and draped in standard  fashion.  Timeout was called.  A deltopectoral approach was made to the right shoulder through an approximately 8 cm incision.  Skin flaps were elevated dissection carried deeply and the deltopectoral interval was developed from proximal to distal with the vein taken laterally.  Upper centimeter and half of the pectoralis major was tenotomized for exposure and the conjoined tendon was mobilized and retracted medially.  The long head biceps tendon was then tenodesed at the upper border the pectoralis major tendon with proximal segment unroofed and excised.  The rotator cuff was then split along the rotator interval to the base of the coracoid and the insertion of the subscapularis was then identified superiorly and inferiorly at its lesser tuberosity insertion.  An oscillating saw was then used to perform a thin lesser tuberosity osteotomy and the subscap was then tagged mobilized and reflected medially.  Capsular attachments were then divided from the anterior inferior margins of the humeral neck and humeral head was then delivered through the wound.  An extra medullary guide was then used to outline our proposed humeral head resection which was then performed with an oscillating saw at approximately 30 degrees retroversion with care taken to protect the surrounding superior and posterior rotator cuff.  Peripheral osteophytes were then removed with a rondure and the proximal humeral metaphysis was then sized at a 43.  Initial preparations were then performed with our trunnion guide and we then determined a medium cage screw would be appropriate.  Upon completion and a metal cap was then placed over the cut proximal humeral surface and we then exposed the glenoid with the appropriate retractors and performed a circumferential labral resection.  A guidepin was then directed into the center of the glenoid and the glenoid was sized at a medium and then  reamed to a stable subchondral bony bed.  Preparation completed with  the central drill followed by the superior and inferior peg and slot respectively and then broaching completed preparation with a trial showing excellent fit.  At this point the glenoid was meticulously cleaned and dried.  Cement was mixed and introduced into the superior and inferior peg and slot respectively.  Bone graft was placed around the central peg of the glenoid and the glenoid was then impacted with excellent fit and fixation.  We then returned our attention to the proximal humeral metaphysis where our initial sizing was confirmed and our trunnion was placed after a suture tape suture was placed to the eyelid on the collar of the trunnion.  The trunnion was then seated in the medium cage screw was placed obtaining excellent purchase and fixation.  A series of trial reductions were then performed and ultimately we found that the 45 x 17 humeral head gave [place] the best soft tissue balance motion and stability with approximately 50% translation over the glenoid and excellent elasticity.  This point the trial was removed.  We placed 2 additional medial row anchors on the medial margin of the lesser tuberosity osteotomy site to prepare for the ultimate subscap repair.  Upon completion we then cleaned the implant and the final all titanium 45 x 17 head was then impacted.  Final reduction was then completed showing excellent motion stability and soft tissue balance.  We then confirmed good elasticity and mobility the subscapularis.  Our 3 medial row anchor sutures were then passed up through the bone tendon junction equidistant across the width of the lesser tuberosity osteotomy.  An apex suture placed at the upper border the subscap to the supraspinatus to allow proper positioning and once this was completed we then passed the medial row sutures into 2 lateral anchors creating a double row repair which allowed excellent apposition of the lesser tuberosity fragment to the donor site with good stability achieved  much to our satisfaction.  The rotator interval was then closed with a series of figure-of-eight suture tape sutures.  Upon completion the arm easily achieved 45 degrees of external rotation without excessive tension on the subscapularis.  Final irrigation was then completed.  Hemostasis was obtained.  Vancomycin powder was then spread liberally throughout the deep soft tissue layers.  The deltopectoral interval was reapproximated with a series of figure-of-eight #1 Vicryl sutures.  2-0 Monocryl used for subcu layer and intracuticular 3-0 Monocryl for the skin followed by Dermabond and Aquacel dressing.  The right arm was placed into a sling.  The patient was awakened, extubated, and taken to the recovery room in stable condition  Jenetta Loges, PA-C was utilized as an Environmental consultant throughout this case, essential for help with positioning the patient, positioning extremity, tissue manipulation, implantation of the prosthesis, suture management, wound closure, and intraoperative decision-making.  Marin Shutter MD  Contact # [phone]

## 2020-12-08 NOTE — Discharge Instructions (Signed)
 Kevin M. Supple, M.D., F.A.A.O.S. Orthopaedic Surgery Specializing in Arthroscopic and Reconstructive Surgery of the Shoulder [phone] [address] - [place], [place] [address] - Fax [phone]   POST-OP TOTAL SHOULDER REPLACEMENT INSTRUCTIONS  1. Follow up in the office for your first post-op appointment 10-14 days from the date of your surgery. If you do not already have a scheduled appointment, our office will contact you to schedule.  2. The bandage over your incision is waterproof. You may begin showering with this dressing on. You may leave this dressing on until first follow up appointment within 2 weeks. We prefer you leave this dressing in place until follow up however after 5-7 days if you are having itching or skin irritation and would like to remove it you may do so. Go slow and tug at the borders gently to break the bond the dressing has with the skin. At this point if there is no drainage it is okay to go without a bandage or you may cover it with a light guaze and tape. You can also expect significant bruising around your shoulder that will drift down your arm and into your chest wall. This is very normal and should resolve over several days.   3. Wear your sling/immobilizer at all times except to perform the exercises below or to occasionally let your arm dangle by your side to stretch your elbow. You also need to sleep in your sling immobilizer until instructed otherwise. It is ok to remove your sling if you are sitting in a controlled environment and allow your arm to rest in a position of comfort by your side or on your lap with pillows to give your neck and skin a break from the sling. You may remove it to allow arm to dangle by side to shower. If you are up walking around and when you go to sleep at night you need to wear it.  4. Range of motion to your elbow, wrist, and hand are encouraged 3-5 times daily. Exercise to your hand and fingers helps to reduce  swelling you may experience.   5. Prescriptions for a pain medication and a muscle relaxant are provided for you. It is recommended that if you are experiencing pain that you pain medication alone is not controlling, add the muscle relaxant along with the pain medication which can give additional pain relief. The first 1-2 days is generally the most severe of your pain and then should gradually decrease. As your pain lessens it is recommended that you decrease your use of the pain medications to an "as needed basis'" only and to always comply with the recommended dosages of the pain medications.  6. Pain medications can produce constipation along with their use. If you experience this, the use of an over the counter stool softener or laxative daily is recommended.   7. For additional questions or concerns, please do not hesitate to call the office. If after hours there is an answering service to forward your concerns to the physician on call.  8.Pain control following an exparel block  To help control your post-operative pain you received a nerve block  performed with Exparel which is a long acting anesthetic (numbing agent) which can provide pain relief and sensations of numbness (and relief of pain) in the operative shoulder and arm for up to 3 days. Sometimes it provides mixed relief, meaning you may still have numbness in certain areas of the arm but can still be able to   move  parts of that arm, hand, and fingers. We recommend that your prescribed pain medications  be used as needed. We do not feel it is necessary to "pre medicate" and "stay ahead" of pain.  Taking narcotic pain medications when you are not having any pain can lead to unnecessary and potentially dangerous side effects.    9. Use the ice machine as much as possible in the first 5-7 days from surgery, then you can wean its use to as needed. The ice typically needs to be replaced every 6 hours, instead of ice you can actually freeze  water bottles to put in the cooler and then fill water around them to avoid having to purchase ice. You can have spare water bottles freezing to allow you to rotate them once they have melted. Try to have a thin shirt or light cloth or towel under the ice wrap to protect your skin.   FOR ADDITIONAL INFO ON ICE MACHINE AND INSTRUCTIONS GO TO THE WEBSITE AT  https://[email or web address].com/products/donjoy/[email or web address]  10.  We recommend that you avoid any dental work or cleaning in the first 3 months following your joint replacement. This is to help minimize the possibility of infection from the bacteria in your mouth that enters your bloodstream during dental work. We also recommend that you take an antibiotic prior to your dental work for the first year after your shoulder replacement to further help reduce that risk. Please simply contact our office for antibiotics to be sent to your pharmacy prior to dental work.  11. Dental Antibiotics:  In most cases prophylactic antibiotics for Dental procdeures after total joint surgery are not necessary.  Exceptions are as follows:  1. History of prior total joint infection  2. Severely immunocompromised (Organ Transplant, cancer chemotherapy, Rheumatoid biologic meds such as Humera)  3. Poorly controlled diabetes (A1C &gt; 8.0, blood glucose over 200)  If you have one of these conditions, contact your surgeon for an antibiotic prescription, prior to your dental procedure.   POST-OP EXERCISES  Pendulum Exercises  Perform pendulum exercises while standing and bending at the waist. Support your uninvolved arm on a table or chair and allow your operated arm to hang freely. Make sure to do these exercises passively - not using you shoulder muscles. These exercises can be performed once your nerve block effects have worn off.  Repeat 20 times. Do 3 sessions per day.     

## 2020-12-09 ENCOUNTER — Encounter (HOSPITAL_COMMUNITY): Payer: Self-pay | Admitting: Orthopedic Surgery

## 2022-11-25 IMAGING — US US ABDOMEN COMPLETE
1 series · 14 of 25 positions shown · non-contrast
Comparison: CT abdomen pelvis January 16, 2015

CLINICAL DATA: Upper abdominal pain x1 month

EXAM:
ABDOMEN ULTRASOUND COMPLETE

[Series 1: us abdomen complete · 0.20mm/px · 14 of 78 slices shown]
[im 1/78]
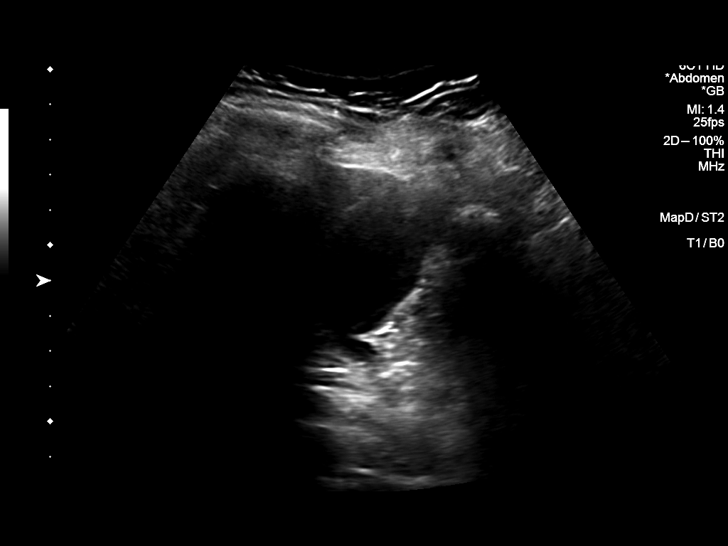
[im 7/78]
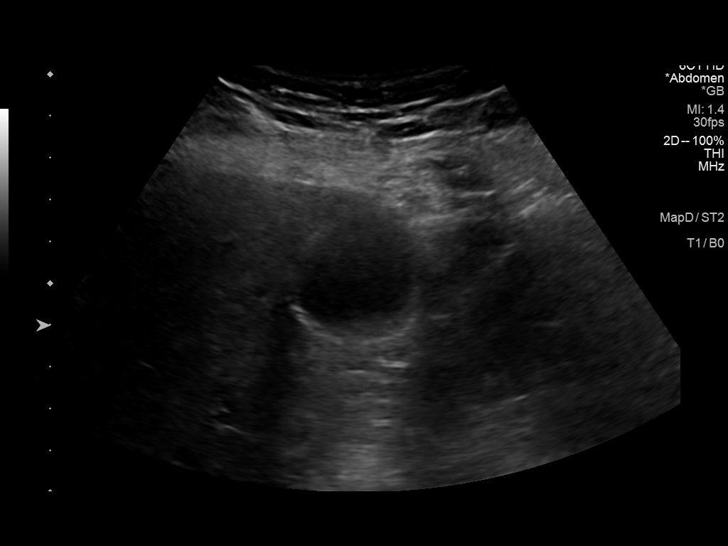
[im 13/78]
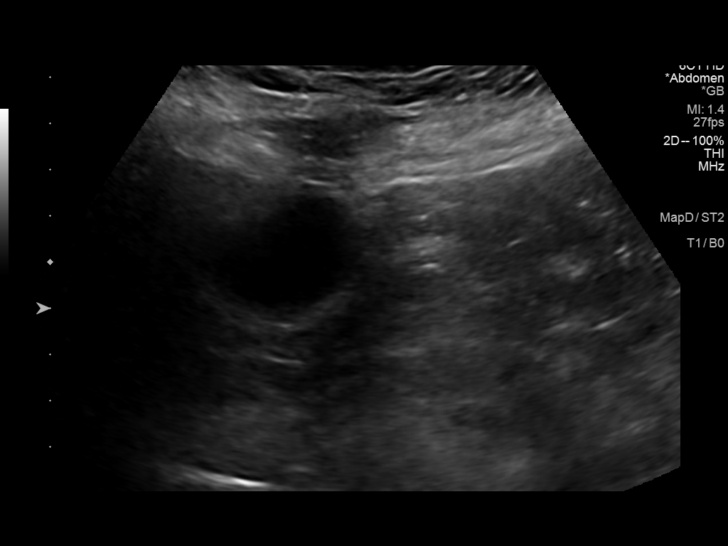
[im 20/78]
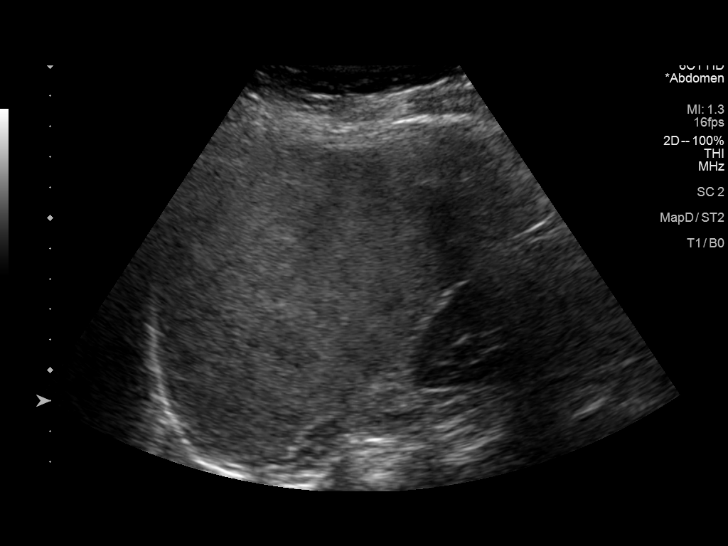
[im 26/78]
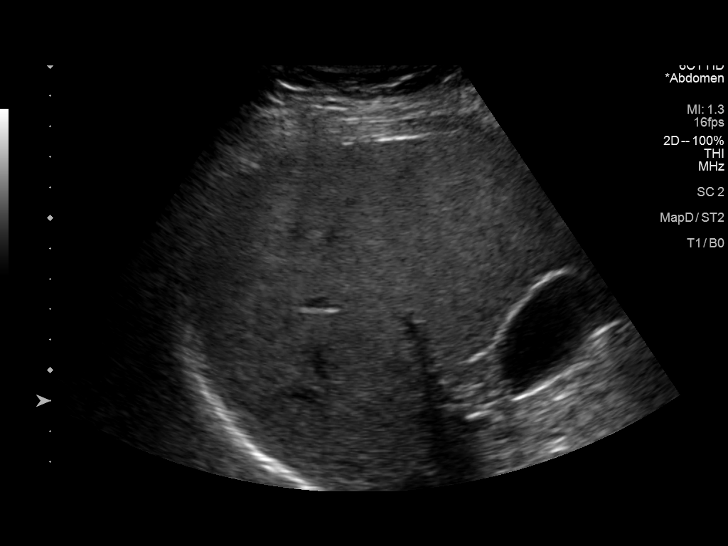
[im 29/78]
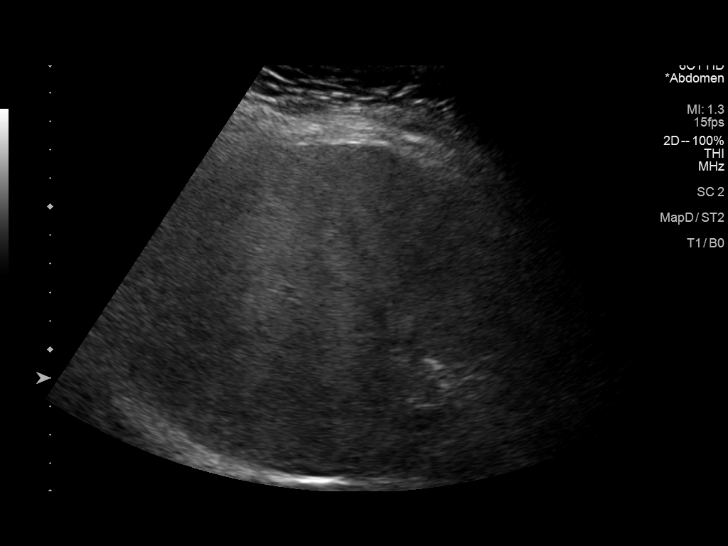
[im 36/78]
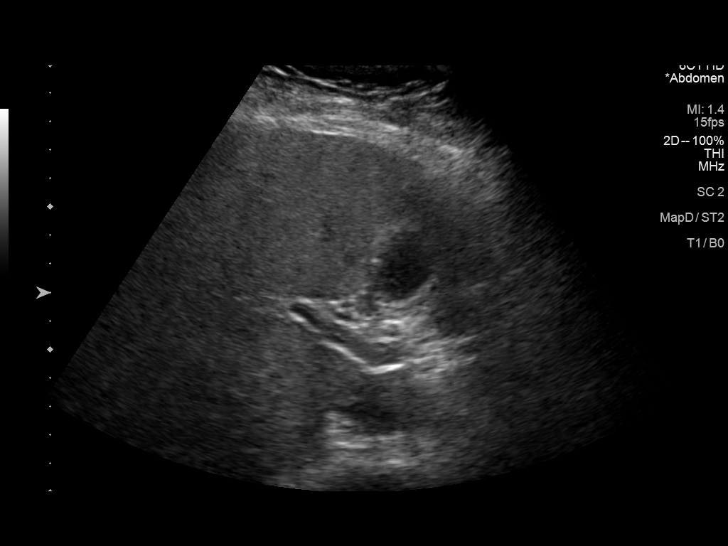
[im 42/78]
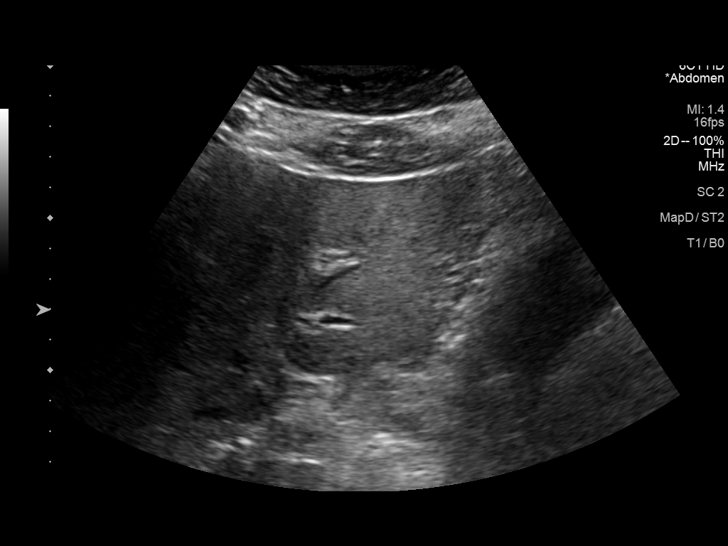
[im 49/78]
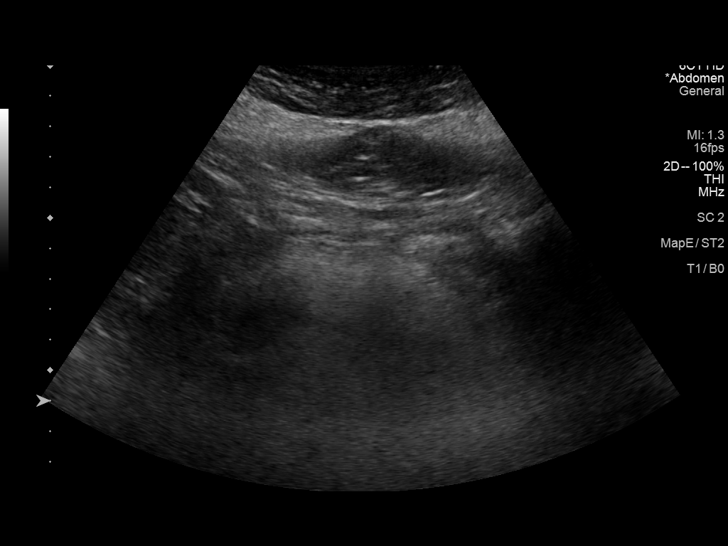
[im 52/78]
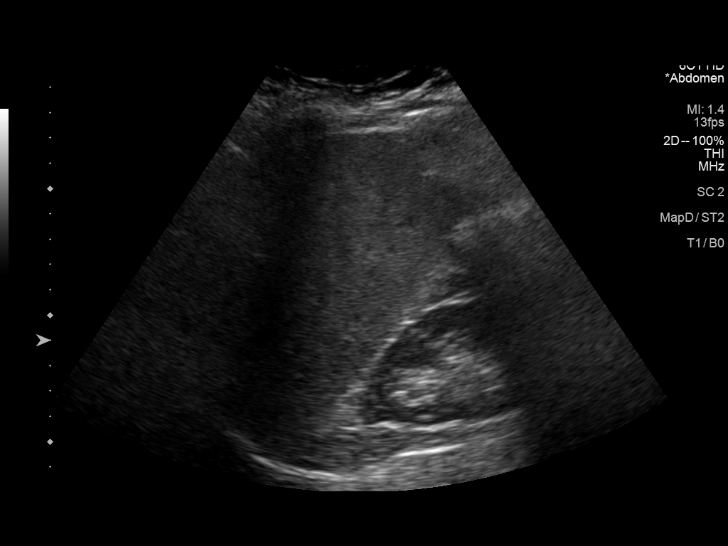
[im 58/78]
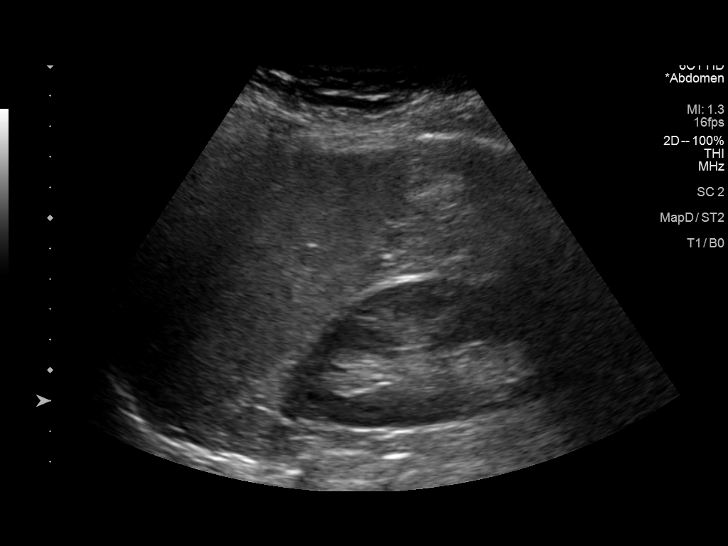
[im 65/78]
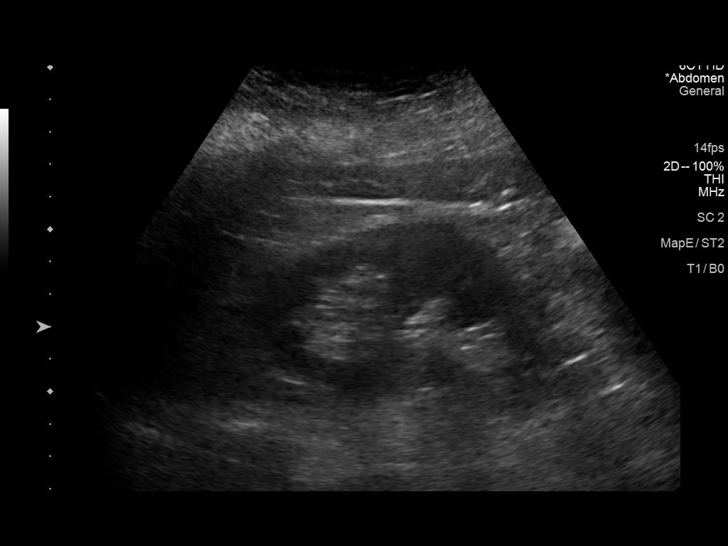
[im 71/78]
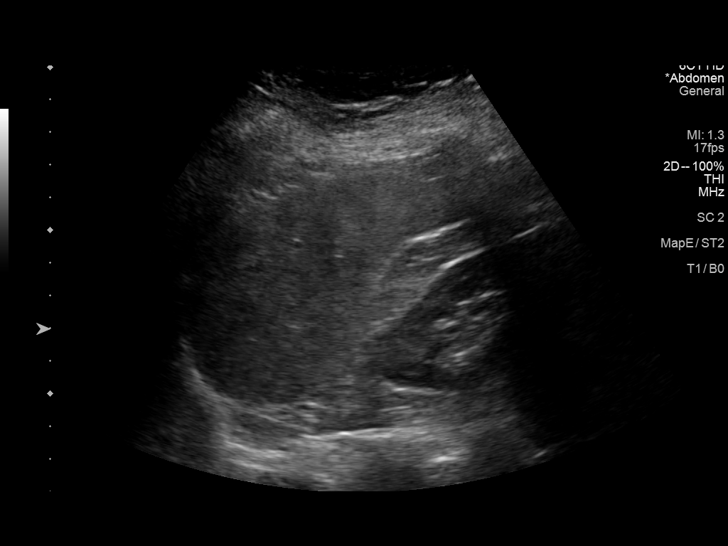
[im 78/78]
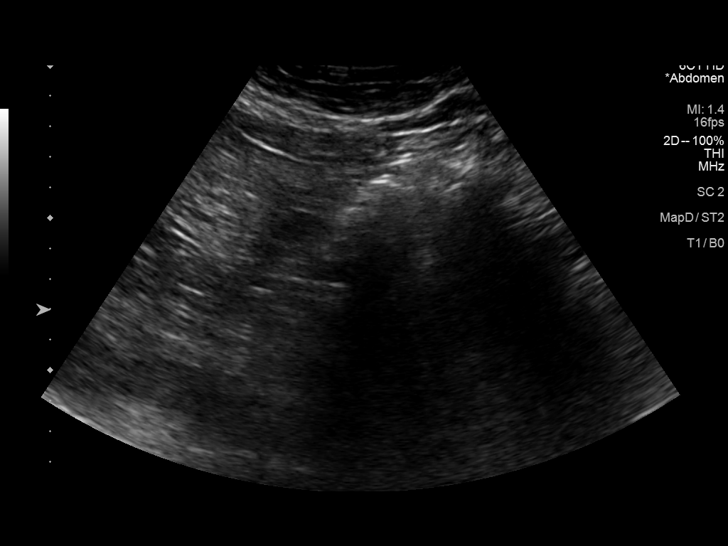

[14 of 25 positions shown; findings below may reference images not displayed]

FINDINGS: Gallbladder: No gallstones or wall thickening visualized. No
sonographic Murphy sign noted by sonographer.

Common bile duct: Diameter: 3 mm

Liver: No focal lesion identified. Coarsened echotexture with
heterogeneously increased parenchymal echogenicity. Portal vein is
patent on color Doppler imaging with normal direction of blood flow
towards the liver.

IVC: No abnormality visualized.

Pancreas: Visualized portion unremarkable.

Spleen: Size and appearance within normal limits.

Right Kidney: Length: 10.9 cm. Echogenicity within normal limits. No
mass or hydronephrosis visualized.

Left Kidney: Length: 11 cm. Echogenicity within normal limits. No
mass or hydronephrosis visualized.

Abdominal aorta: No aneurysm visualized.

Other findings: None.
IMPRESSION: 1. Heterogeneous appearance of the liver suggesting fatty
infiltration or other diffuse hepatocellular disease.
2. Unremarkable sonographic appearance of the gallbladder. No
biliary dilatation.

## 2024-01-17 ENCOUNTER — Other Ambulatory Visit: Payer: Self-pay

## 2024-01-17 ENCOUNTER — Other Ambulatory Visit: Payer: Self-pay | Admitting: Gastroenterology

## 2024-01-17 ENCOUNTER — Observation Stay (HOSPITAL_COMMUNITY)
Admission: EM | Admit: 2024-01-17 | Discharge: 2024-01-19 | Disposition: A | Discharge status: Home / Self Care | Attending: Internal Medicine | Admitting: Internal Medicine

## 2024-01-17 ENCOUNTER — Encounter (HOSPITAL_COMMUNITY): Payer: Self-pay | Admitting: Family Medicine

## 2024-01-17 DIAGNOSIS — K922 Gastrointestinal hemorrhage, unspecified: Principal | ICD-10-CM | POA: Insufficient documentation

## 2024-01-17 DIAGNOSIS — E871 Hypo-osmolality and hyponatremia: Secondary | ICD-10-CM | POA: Diagnosis not present

## 2024-01-17 DIAGNOSIS — F1090 Alcohol use, unspecified, uncomplicated: Secondary | ICD-10-CM | POA: Insufficient documentation

## 2024-01-17 DIAGNOSIS — N939 Abnormal uterine and vaginal bleeding, unspecified: Secondary | ICD-10-CM | POA: Insufficient documentation

## 2024-01-17 DIAGNOSIS — K625 Hemorrhage of anus and rectum: Secondary | ICD-10-CM | POA: Diagnosis present

## 2024-01-17 DIAGNOSIS — E66811 Obesity, class 1: Secondary | ICD-10-CM | POA: Insufficient documentation

## 2024-01-17 DIAGNOSIS — I10 Essential (primary) hypertension: Secondary | ICD-10-CM | POA: Diagnosis not present

## 2024-01-17 DIAGNOSIS — R1011 Right upper quadrant pain: Secondary | ICD-10-CM

## 2024-01-17 DIAGNOSIS — Z6832 Body mass index (BMI) 32.0-32.9, adult: Secondary | ICD-10-CM | POA: Diagnosis not present

## 2024-01-17 DIAGNOSIS — F32A Depression, unspecified: Secondary | ICD-10-CM | POA: Insufficient documentation

## 2024-01-17 DIAGNOSIS — F419 Anxiety disorder, unspecified: Secondary | ICD-10-CM | POA: Diagnosis present

## 2024-01-17 DIAGNOSIS — J45909 Unspecified asthma, uncomplicated: Secondary | ICD-10-CM | POA: Insufficient documentation

## 2024-01-17 LAB — COMPREHENSIVE METABOLIC PANEL WITH GFR
ALT: 17 U/L (ref 0–44)
AST: 18 U/L (ref 15–41)
Albumin: 4.8 g/dL (ref 3.5–5.0)
Alkaline Phosphatase: 38 U/L (ref 38–126)
Anion gap: 10 (ref 5–15)
BUN: 14 mg/dL (ref 8–23)
CO2: 26 mmol/L (ref 22–32)
Calcium: 9.4 mg/dL (ref 8.9–10.3)
Chloride: 98 mmol/L (ref 98–111)
Creatinine, Ser: 0.62 mg/dL (ref 0.44–1.00)
GFR, Estimated: >60 mL/min (ref >60)
Glucose, Bld: 101 mg/dL — ABNORMAL HIGH (ref 70–99)
Potassium: 4.5 mmol/L (ref 3.5–5.1)
Sodium: 134 mmol/L — ABNORMAL LOW (ref 135–145)
Total Bilirubin: 1.3 mg/dL — ABNORMAL HIGH (ref 0.0–1.2)
Total Protein: 7.2 g/dL (ref 6.5–8.1)

## 2024-01-17 LAB — HEMATOCRIT: HCT: 40.2 % (ref 36.0–46.0)

## 2024-01-17 LAB — PROTIME-INR
INR: 1 (ref 0.8–1.2)
Prothrombin Time: 13.5 s (ref 11.4–15.2)

## 2024-01-17 LAB — CBC WITH DIFFERENTIAL/PLATELET
Abs Immature Granulocytes: 0.04 K/uL (ref 0.00–0.07)
Basophils Absolute: 0.1 K/uL (ref 0.0–0.1)
Basophils Relative: 1 %
Eosinophils Absolute: 0.1 K/uL (ref 0.0–0.5)
Eosinophils Relative: 2 %
HCT: 42.9 % (ref 36.0–46.0)
Hemoglobin: 14.7 g/dL (ref 12.0–15.0)
Immature Granulocytes: 1 %
Lymphocytes Relative: 22 %
Lymphs Abs: 1.8 K/uL (ref 0.7–4.0)
MCH: 32.8 pg (ref 26.0–34.0)
MCHC: 34.3 g/dL (ref 30.0–36.0)
MCV: 95.8 fL (ref 80.0–100.0)
Monocytes Absolute: 0.7 K/uL (ref 0.1–1.0)
Monocytes Relative: 9 %
Neutro Abs: 5.5 K/uL (ref 1.7–7.7)
Neutrophils Relative %: 65 %
Platelets: 326 K/uL (ref 150–400)
RBC: 4.48 MIL/uL (ref 3.87–5.11)
RDW: 13 % (ref 11.5–15.5)
WBC: 8.2 K/uL (ref 4.0–10.5)
nRBC: 0 % (ref 0.0–0.2)

## 2024-01-17 LAB — HEMOGLOBIN: Hemoglobin: 13.5 g/dL (ref 12.0–15.0)

## 2024-01-17 LAB — TYPE AND SCREEN
ABO/RH(D): O POS
Antibody Screen: NEGATIVE

## 2024-01-17 LAB — LIPASE, BLOOD: Lipase: 27 U/L (ref 11–51)

## 2024-01-17 MED ORDER — PANTOPRAZOLE SODIUM 40 MG IV SOLR
40.0000 mg | Freq: Once | INTRAVENOUS | Status: AC
  Administered 2024-01-17: 40 mg via INTRAVENOUS
  Filled 2024-01-17: qty 10

## 2024-01-17 MED ORDER — SODIUM CHLORIDE 0.9% FLUSH
3.0000 mL | Freq: Two times a day (BID) | INTRAVENOUS | Status: DC
  Administered 2024-01-17 – 2024-01-18 (×3): 3 mL via INTRAVENOUS

## 2024-01-17 MED ORDER — ONDANSETRON HCL 4 MG PO TABS: 4.0000 mg | ORAL_TABLET | Freq: Four times a day (QID) | ORAL | Status: DC | PRN

## 2024-01-17 MED ORDER — OXYCODONE HCL 5 MG PO TABS
5.0000 mg | ORAL_TABLET | ORAL | Status: DC | PRN
  Filled 2024-01-17: qty 1

## 2024-01-17 MED ORDER — HYDROMORPHONE HCL 1 MG/ML IJ SOLN: 0.5000 mg | INTRAMUSCULAR | Status: DC | PRN

## 2024-01-17 MED ORDER — ACETAMINOPHEN 650 MG RE SUPP: 650.0000 mg | Freq: Four times a day (QID) | RECTAL | Status: DC | PRN

## 2024-01-17 MED ORDER — ALPRAZOLAM 0.5 MG PO TABS
0.5000 mg | ORAL_TABLET | Freq: Every day | ORAL | Status: DC | PRN
  Administered 2024-01-17 – 2024-01-18 (×2): 0.5 mg via ORAL
  Filled 2024-01-17 (×2): qty 1

## 2024-01-17 MED ORDER — FLUTICASONE FUROATE-VILANTEROL 100-25 MCG/ACT IN AEPB
1.0000 | INHALATION_SPRAY | Freq: Every day | RESPIRATORY_TRACT | Status: DC
  Filled 2024-01-17: qty 28

## 2024-01-17 MED ORDER — ALBUTEROL SULFATE (2.5 MG/3ML) 0.083% IN NEBU: 3.0000 mL | INHALATION_SOLUTION | Freq: Four times a day (QID) | RESPIRATORY_TRACT | Status: DC | PRN

## 2024-01-17 MED ORDER — SODIUM CHLORIDE 0.9 % IV SOLN: INTRAVENOUS | Status: DC

## 2024-01-17 MED ORDER — ONDANSETRON HCL 4 MG/2ML IJ SOLN: 4.0000 mg | Freq: Four times a day (QID) | INTRAMUSCULAR | Status: DC | PRN

## 2024-01-17 MED ORDER — ACETAMINOPHEN 325 MG PO TABS
650.0000 mg | ORAL_TABLET | Freq: Four times a day (QID) | ORAL | Status: DC | PRN
  Administered 2024-01-17: 650 mg via ORAL
  Filled 2024-01-17: qty 2

## 2024-01-17 NOTE — Plan of Care (Signed)
  Problem: Education: Goal: Knowledge of General Education information will improve Description: Including pain rating scale, medication(s)/side effects and non-pharmacologic comfort measures Outcome: Progressing   Problem: Clinical Measurements: Goal: Ability to maintain clinical measurements within normal limits will improve Outcome: Progressing Goal: Diagnostic test results will improve Outcome: Progressing   Problem: Coping: Goal: Level of anxiety will decrease Outcome: Progressing   Problem: Pain Managment: Goal: General experience of comfort will improve and/or be controlled Outcome: Progressing

## 2024-01-17 NOTE — ED Triage Notes (Signed)
 Pt arrived reporting sent over by doc for rectal bleeding. Denies changes to stool. Endorses lower abdominal pain, hx of diverticulitis. Pt has colonoscopy scheduled in 1 month.No other issues reported

## 2024-01-17 NOTE — H&P (Signed)
 History and Physical    [name] FMW:[id number] DOB: 1955/09/29 DOA: 01/17/2024  PCP: Claudene Pellet, MD   Patient coming from: Home   Chief Complaint: Rectal bleeding  HPI: Sylvia Hopkins is a 68 y.o. female with medical history significant for hypertension, hypothyroidism, anxiety, ADHD, asthma, and diverticulosis who presents with rectal bleeding.  Patient felt a need to move her bowels this morning and noted red blood in the commode.  She has had an additional 3 episodes since then.  There has been some mild cramping discomfort but no real pain associated with this.  Patient does also mention that she has had some postprandial right upper quadrant discomfort recently, but none now.  She denies any nausea or vomiting.  ED Course: Upon arrival to the ED, patient is found to be afebrile and saturating well on room air with elevated blood pressure.  Labs are most notable for normal creatinine and normal hemoglobin.  Patient was given IV Protonix  in the ED. GI (Dr. Rosalie) was consulted by the ED physician and advised that patient could be discharged tomorrow if symptoms are resolved and Hgb stable, but to consult them again if not.   Review of Systems:  All other systems reviewed and apart from HPI, are negative.  Past Medical History:  Diagnosis Date   ADHD    Alcohol use    Anxiety    Arthritis    sholders. back, lt knee   Asthma due to environmental allergies    inhalers 2x a day   Back pain    Constipation    Depression    Diverticula of colon    Diverticulitis 2016   Fatigue    GERD (gastroesophageal reflux disease)    Hay fever    Headache    migraines with allergies   History of hiatal hernia    HTN (hypertension)    no meds. watching   Joint pain    Seasonal allergies    Shortness of breath on exertion    Swelling of extremity    Thyroid  disease    Vertigo 2022   Vision changes    Vitamin D  deficiency     Past Surgical History:  Procedure  Laterality Date   CESAREAN SECTION     x2   COLONOSCOPY     HAND SURGERY     TONSILLECTOMY     TOTAL SHOULDER ARTHROPLASTY Right 12/08/2020   Procedure: TOTAL SHOULDER ARTHROPLASTY;  Surgeon: Melita Drivers, MD;  Location: [facility];  Service: Orthopedics;  Laterality: Right;     Social History:   reports that she has never smoked. She has never used smokeless tobacco. She reports current alcohol use. She reports that she does not use drugs.  Allergies  Allergen Reactions   Advil [Ibuprofen] Other (See Comments)    Upset stomach  Nsaids   Aspirin     Other reaction(s): GI bleed    Family History  Problem Relation Age of Onset   Emphysema Mother    Depression Mother    Anxiety disorder Mother    Alcoholism Mother    Prostate cancer Father    Heart disease Father    Hyperlipidemia Father    Hypertension Father    Cancer Father    Hyperlipidemia Sister    Heart disease Brother    Hypertension Brother    Hepatitis C Child      Prior to Admission medications   Medication Sig Start Date End Date Taking? Authorizing Provider  acetaminophen  (TYLENOL ) 500 MG tablet Take 1,000 mg by mouth daily as needed for pain. 04/09/19   [provider]  albuterol  (PROVENTIL  HFA;VENTOLIN  HFA) 108 (90 BASE) MCG/ACT inhaler Inhale 2 puffs into the lungs every 6 (six) hours as needed for wheezing or shortness of breath.    [provider]  ALPRAZolam  (XANAX ) 0.5 MG tablet Take 0.5 mg by mouth daily as needed for anxiety.    [provider]  amphetamine -dextroamphetamine  (ADDERALL) 30 MG tablet Take 1 tablet by mouth 2 (two) times daily. Patient taking differently: No sig reported 05/22/18   Delores Shields A, DO  azelastine (ASTELIN) 0.1 % nasal spray Place 1 spray into both nostrils daily as needed for allergies. Use in each nostril as directed    [provider]  calcium elemental as carbonate (TUMS ULTRA 1000) 400 MG chewable tablet Chew 1,000 mg by mouth  daily as needed for heartburn. 08/14/18   [provider]  Cholecalciferol (DIALYVITE VITAMIN D  5000) 125 MCG (5000 UT) capsule Take 5,000 Units by mouth daily.    [provider]  cyclobenzaprine  (FLEXERIL ) 10 MG tablet Take 1 tablet (10 mg total) by mouth 3 (three) times daily as needed for muscle spasms. 12/08/20   Shuford, Randine, PA-C  Dextran 70-Hypromellose, PF, (GENTEAL TEARS PF) 0.1-0.3 % SOLN Place 1 drop into both eyes at bedtime.    [provider]  diphenhydrAMINE (BENADRYL) 25 MG tablet Take 25 mg by mouth every 6 (six) hours as needed for allergies or sleep.    [provider]  DIVIGEL 1 MG/GM GEL Apply 1 mg topically daily. 11/09/20   [provider]  estradiol (CLIMARA - DOSED IN MG/24 HR) 0.025 mg/24hr patch Place 0.025 mg onto the skin 2 (two) times a week.    [provider]  estradiol (ESTRACE) 0.1 MG/GM vaginal cream Place 1 Applicatorful vaginally once a week. 11/14/20   [provider]  famotidine (PEPCID) 20 MG tablet Take 20 mg by mouth daily as needed (Acid reflux). 11/16/20   [provider]  fexofenadine (ALLEGRA) 60 MG tablet Take 60 mg by mouth 2 (two) times daily.    [provider]  fexofenadine-pseudoephedrine (ALLEGRA-D) 60-120 MG 12 hr tablet Take 1 tablet by mouth 2 (two) times daily.    [provider]  fluticasone  (FLONASE ) 50 MCG/ACT nasal spray Place 1 spray into both nostrils daily as needed for allergies.    [provider]  mometasone-formoterol (DULERA) 100-5 MCG/ACT AERO Inhale 2 puffs into the lungs 2 (two) times daily. 04/27/15   [provider]  Naphazoline-Pheniramine (OPCON-A) 0.027-0.315 % SOLN Place 1 drop into both eyes daily as needed (Allergies).    [provider]  ondansetron  (ZOFRAN ) 4 MG tablet Take 1 tablet (4 mg total) by mouth every 8 (eight) hours as needed for nausea or vomiting. 12/08/20   Shuford, Randine, PA-C  OVER THE COUNTER  MEDICATION Take 1-2 capsules by mouth as needed (Congestion/Inflammation). SUPER QUERCETIN    [provider]  oxyCODONE -acetaminophen  (PERCOCET) 5-325 MG tablet Take 1 tablet by mouth every 4 (four) hours as needed (max 6 q). 12/08/20   Shuford, Randine, PA-C  progesterone (PROMETRIUM) 100 MG capsule Take 100 mg by mouth every other day. 11/08/20   [provider]  pseudoephedrine (SUDAFED) 30 MG tablet Take 30 mg by mouth every 6 (six) hours as needed for congestion.    [provider]    Physical Exam: Vitals:   01/17/24 1714 01/17/24 1717  01/17/24 1745 01/17/24 2006  BP:  (!) 165/110 (!) 146/105   Pulse:  (!) 106 98   Resp:  18    Temp: 98.2 F (36.8 C)     TempSrc: Oral     SpO2:  98% 96%   Weight:    85.7 kg  Height:    5' 4 (1.626 m)    Constitutional: NAD, no pallor or diaphoresis   Eyes: PERTLA, lids and conjunctivae normal ENMT: Mucous membranes are moist. Posterior pharynx clear of any exudate or lesions.   Neck: supple, no masses  Respiratory: no wheezing, no crackles. No accessory muscle use.  Cardiovascular: S1 & S2 heard, regular rate and rhythm. No extremity edema.  Abdomen: No distension, no tenderness, soft. Bowel sounds active.  Musculoskeletal: no clubbing / cyanosis. No joint deformity upper and lower extremities.   Skin: no significant rashes, lesions, ulcers. Warm, dry, well-perfused. Neurologic: CN 2-12 grossly intact. Moving all extremities. Alert and oriented.  Psychiatric: Calm. Cooperative.    Labs and Imaging on Admission: I have personally reviewed following labs and imaging studies  CBC: Recent Labs  Lab 01/17/24 1812  WBC 8.2  NEUTROABS 5.5  HGB 14.7  HCT 42.9  MCV 95.8  PLT 326   Basic Metabolic Panel: Recent Labs  Lab 01/17/24 1812  NA 134*  K 4.5  CL 98  CO2 26  GLUCOSE 101*  BUN 14  CREATININE 0.62  CALCIUM 9.4   GFR: Estimated Creatinine Clearance: 70.3 mL/min (by C-G formula based on SCr of 0.62  mg/dL). Liver Function Tests: Recent Labs  Lab 01/17/24 1812  AST 18  ALT 17  ALKPHOS 38  BILITOT 1.3*  PROT 7.2  ALBUMIN 4.8   Recent Labs  Lab 01/17/24 1812  LIPASE 27   No results for input(s): AMMONIA in the last 168 hours. Coagulation Profile: Recent Labs  Lab 01/17/24 1812  INR 1.0   Cardiac Enzymes: No results for input(s): CKTOTAL, CKMB, CKMBINDEX, TROPONINI in the last 168 hours. BNP (last 3 results) No results for input(s): PROBNP in the last 8760 hours. HbA1C: No results for input(s): HGBA1C in the last 72 hours. CBG: No results for input(s): GLUCAP in the last 168 hours. Lipid Profile: No results for input(s): CHOL, HDL, LDLCALC, TRIG, CHOLHDL, LDLDIRECT in the last 72 hours. Thyroid  Function Tests: No results for input(s): TSH, T4TOTAL, FREET4, T3FREE, THYROIDAB in the last 72 hours. Anemia Panel: No results for input(s): VITAMINB12, FOLATE, FERRITIN, TIBC, IRON, RETICCTPCT in the last 72 hours. Urine analysis:    Component Value Date/Time   COLORURINE YELLOW 01/16/2015 2034   APPEARANCEUR CLEAR 01/16/2015 2034   LABSPEC 1.008 01/16/2015 2034   PHURINE 7.0 01/16/2015 2034   GLUCOSEU NEGATIVE 01/16/2015 2034   HGBUR NEGATIVE 01/16/2015 2034   BILIRUBINUR NEGATIVE 01/16/2015 2034   KETONESUR 15 (A) 01/16/2015 2034   PROTEINUR NEGATIVE 01/16/2015 2034   UROBILINOGEN 0.2 01/16/2015 2034   NITRITE NEGATIVE 01/16/2015 2034   LEUKOCYTESUR NEGATIVE 01/16/2015 2034   Sepsis Labs: @LABRCNTIP (procalcitonin:4,lacticidven:4) )No results found for this or any previous visit (from the past 240 hours).   Radiological Exams on Admission: No results found.   Assessment/Plan   1. Painless hematochezia  - Continue bowel rest, trend H&H, check CTA if severe bleeding develops, transfuse if needed   2. Asthma  - Not in exacerbation  - Continue ICS-LABA and as-needed SABA    3. Anxiety  - Continue as-needed  Xanax      DVT prophylaxis: SCDs  Code  Status: Full  Level of Care: Level of care: Telemetry Family Communication: Husband at bedside  Disposition Plan:  Patient is from: Home  Anticipated d/c is to: Home  Anticipated d/c date is: Possibly as early as 01/18/24  Patient currently: Pending stable H&H  Consults called: GI  Admission status: Observation     Evalene GORMAN Sprinkles, MD [facility]  01/17/2024, 9:30 PM

## 2024-01-17 NOTE — ED Provider Notes (Signed)
 [place] EMERGENCY DEPARTMENT AT [facility] Provider Note   CSN: [id number] Arrival date & time: 01/17/24  1702     Patient presents with: Rectal Bleeding   Sylvia Hopkins is a 68 y.o. female.   HPI Patient had had coffee this morning and then had to go to the bathroom for bowel movement.  She was not in any pain.  She reports she had a bowel movement and that turned out to be a large volume of cranberry colored blood and then bright red blood.  Patient reports that over the course of the next number of hours she had 2 more episodes of bloody stool.  She was able to call her gastroenterologist Dr. Herschell and get worked into the office schedule at the end of the day today.  She was seen in the office and recommended to come to the emergency department for observation.  Patient reports she has had another episode of bloody stool since then.  She reports that she now has some bloating discomfort in her abdomen although is not terribly painful.  She is felt slightly lightheaded but has not felt like she was going to pass out.  No chest pain or shortness of breath.  Patient reports that she did have 1 other episode that required hospitalization for GI bleeding.  She reports at that time it was thought to be diverticular bleeding.    Prior to Admission medications   Medication Sig Start Date End Date Taking? Authorizing Provider  acetaminophen  (TYLENOL ) 500 MG tablet Take 1,000 mg by mouth daily as needed for pain. 04/09/19   [provider]  albuterol  (PROVENTIL  HFA;VENTOLIN  HFA) 108 (90 BASE) MCG/ACT inhaler Inhale 2 puffs into the lungs every 6 (six) hours as needed for wheezing or shortness of breath.    [provider]  ALPRAZolam  (XANAX ) 0.5 MG tablet Take 0.5 mg by mouth daily as needed for anxiety.    [provider]  amphetamine -dextroamphetamine  (ADDERALL) 30 MG tablet Take 1 tablet by mouth 2 (two) times daily. Patient taking differently: No sig  reported 05/22/18   Delores Shields A, DO  azelastine (ASTELIN) 0.1 % nasal spray Place 1 spray into both nostrils daily as needed for allergies. Use in each nostril as directed    [provider]  calcium elemental as carbonate (TUMS ULTRA 1000) 400 MG chewable tablet Chew 1,000 mg by mouth daily as needed for heartburn. 08/14/18   [provider]  Cholecalciferol (DIALYVITE VITAMIN D  5000) 125 MCG (5000 UT) capsule Take 5,000 Units by mouth daily.    [provider]  cyclobenzaprine  (FLEXERIL ) 10 MG tablet Take 1 tablet (10 mg total) by mouth 3 (three) times daily as needed for muscle spasms. 12/08/20   Shuford, Randine, PA-C  Dextran 70-Hypromellose, PF, (GENTEAL TEARS PF) 0.1-0.3 % SOLN Place 1 drop into both eyes at bedtime.    [provider]  diphenhydrAMINE (BENADRYL) 25 MG tablet Take 25 mg by mouth every 6 (six) hours as needed for allergies or sleep.    [provider]  DIVIGEL 1 MG/GM GEL Apply 1 mg topically daily. 11/09/20   [provider]  estradiol (CLIMARA - DOSED IN MG/24 HR) 0.025 mg/24hr patch Place 0.025 mg onto the skin 2 (two) times a week.    [provider]  estradiol (ESTRACE) 0.1 MG/GM vaginal cream Place 1 Applicatorful vaginally once a week. 11/14/20   [provider]  famotidine (PEPCID) 20 MG tablet Take 20 mg by  mouth daily as needed (Acid reflux). 11/16/20   [provider]  fexofenadine (ALLEGRA) 60 MG tablet Take 60 mg by mouth 2 (two) times daily.    [provider]  fexofenadine-pseudoephedrine (ALLEGRA-D) 60-120 MG 12 hr tablet Take 1 tablet by mouth 2 (two) times daily.    [provider]  fluticasone  (FLONASE ) 50 MCG/ACT nasal spray Place 1 spray into both nostrils daily as needed for allergies.    [provider]  mometasone-formoterol (DULERA) 100-5 MCG/ACT AERO Inhale 2 puffs into the lungs 2 (two) times daily. 04/27/15   [provider]   Naphazoline-Pheniramine (OPCON-A) 0.027-0.315 % SOLN Place 1 drop into both eyes daily as needed (Allergies).    [provider]  ondansetron  (ZOFRAN ) 4 MG tablet Take 1 tablet (4 mg total) by mouth every 8 (eight) hours as needed for nausea or vomiting. 12/08/20   Shuford, Randine, PA-C  OVER THE COUNTER MEDICATION Take 1-2 capsules by mouth as needed (Congestion/Inflammation). SUPER QUERCETIN    [provider]  oxyCODONE -acetaminophen  (PERCOCET) 5-325 MG tablet Take 1 tablet by mouth every 4 (four) hours as needed (max 6 q). 12/08/20   Shuford, Randine, PA-C  progesterone (PROMETRIUM) 100 MG capsule Take 100 mg by mouth every other day. 11/08/20   [provider]  pseudoephedrine (SUDAFED) 30 MG tablet Take 30 mg by mouth every 6 (six) hours as needed for congestion.    [provider]    Allergies: Advil [ibuprofen] and Aspirin    Review of Systems  Updated Vital Signs BP (!) 146/105   Pulse 98   Temp 98.2 F (36.8 C) (Oral)   Resp 18   Ht 5' 4 (1.626 m)   Wt 85.7 kg   SpO2 96%   BMI 32.44 kg/m   Physical Exam Constitutional:      Comments: Patient is alert nontoxic clinically well in appearance.  HENT:     Mouth/Throat:     Pharynx: Oropharynx is clear.  Eyes:     Extraocular Movements: Extraocular movements intact.  Cardiovascular:     Rate and Rhythm: Normal rate and regular rhythm.  Pulmonary:     Effort: Pulmonary effort is normal.     Breath sounds: Normal breath sounds.  Abdominal:     Comments: Abdomen soft.  No guarding.  Mildly distended.  Not significantly tender to palpation.  Genitourinary:    Comments: Digital rectal exam: Patient has cranberry colored soft stool in the vault. Musculoskeletal:        General: No swelling or tenderness. Normal range of motion.     Right lower leg: No edema.     Left lower leg: No edema.  Skin:    General: Skin is warm and dry.  Neurological:     General: No focal deficit present.      Mental Status: She is oriented to person, place, and time.     Motor: No weakness.     Coordination: Coordination normal.     Gait: Gait normal.  Psychiatric:        Mood and Affect: Mood normal.     (all labs ordered are listed, but only abnormal results are displayed) Labs Reviewed  COMPREHENSIVE METABOLIC PANEL WITH GFR - Abnormal; Notable for the following components:      Result Value   Sodium 134 (*)    Glucose, Bld 101 (*)    Total Bilirubin 1.3 (*)    All other components within normal limits  LIPASE, BLOOD  CBC  WITH DIFFERENTIAL/PLATELET  PROTIME-INR  HIV ANTIBODY (ROUTINE TESTING W REFLEX)  BASIC METABOLIC PANEL WITH GFR  HEMOGLOBIN  HEMOGLOBIN  HEMOGLOBIN  HEMATOCRIT  HEMATOCRIT  HEMATOCRIT  POC OCCULT BLOOD, ED  TYPE AND SCREEN    EKG: None  Radiology: No results found.   Procedures   Medications Ordered in the ED  ALPRAZolam  (XANAX ) tablet 0.5 mg (has no administration in time range)  albuterol  (PROVENTIL ) (2.5 MG/3ML) 0.083% nebulizer solution 3 mL (has no administration in time range)  fluticasone  furoate-vilanterol (BREO ELLIPTA ) 100-25 MCG/ACT 1 puff (has no administration in time range)  sodium chloride  flush (NS) 0.9 % injection 3 mL (has no administration in time range)  acetaminophen  (TYLENOL ) tablet 650 mg (has no administration in time range)    Or  acetaminophen  (TYLENOL ) suppository 650 mg (has no administration in time range)  oxyCODONE  (Oxy IR/ROXICODONE ) immediate release tablet 5 mg (has no administration in time range)  HYDROmorphone  (DILAUDID ) injection 0.5 mg (has no administration in time range)  0.9 %  sodium chloride  infusion (has no administration in time range)  ondansetron  (ZOFRAN ) tablet 4 mg (has no administration in time range)    Or  ondansetron  (ZOFRAN ) injection 4 mg (has no administration in time range)  pantoprazole  (PROTONIX ) injection 40 mg (40 mg Intravenous Given 01/17/24 2000)                                     Medical Decision Making Amount and/or Complexity of Data Reviewed Labs: ordered.  Risk Prescription drug management. Decision regarding hospitalization.   Patient presents as outlined.  At this time hemoglobin stable.  Patient does not have hypotension.  She does have grossly positive stool.  Will treat with a dose of Protonix  and admit for observation.  Consult: Reviewed Dr. Rosalie.  Advises patient can be observed overnight but does not need bleeding scan at this time.  Hospitalist can have low threshold for doing a bleeding scan angiogram if she has increased bleeding or becomes anemic.  If symptoms are resolved and patient is stable by morning should be okay for DC without GI consult but if there are concerns admitting team can consult Dr. Rosalie for consult.  Consult: Dr. Charlton for admission    Final diagnoses:  Lower GI bleed    ED Discharge Orders     None          Armenta Canning, MD 01/17/24 2116

## 2024-01-18 ENCOUNTER — Encounter (HOSPITAL_COMMUNITY): Payer: Self-pay | Admitting: Family Medicine

## 2024-01-18 DIAGNOSIS — K922 Gastrointestinal hemorrhage, unspecified: Secondary | ICD-10-CM | POA: Diagnosis not present

## 2024-01-18 DIAGNOSIS — K625 Hemorrhage of anus and rectum: Secondary | ICD-10-CM | POA: Diagnosis not present

## 2024-01-18 LAB — BASIC METABOLIC PANEL WITH GFR
Anion gap: 5 (ref 5–15)
BUN: 13 mg/dL (ref 8–23)
CO2: 25 mmol/L (ref 22–32)
Calcium: 8.4 mg/dL — ABNORMAL LOW (ref 8.9–10.3)
Chloride: 104 mmol/L (ref 98–111)
Creatinine, Ser: 0.53 mg/dL (ref 0.44–1.00)
GFR, Estimated: >60 mL/min (ref >60)
Glucose, Bld: 101 mg/dL — ABNORMAL HIGH (ref 70–99)
Potassium: 3.8 mmol/L (ref 3.5–5.1)
Sodium: 134 mmol/L — ABNORMAL LOW (ref 135–145)

## 2024-01-18 LAB — HEMOGLOBIN AND HEMATOCRIT, BLOOD
HCT: 36.6 % (ref 36.0–46.0)
Hemoglobin: 12.1 g/dL (ref 12.0–15.0)

## 2024-01-18 LAB — HIV ANTIBODY (ROUTINE TESTING W REFLEX): HIV Screen 4th Generation wRfx: NONREACTIVE

## 2024-01-18 LAB — HEMATOCRIT: HCT: 37.9 % (ref 36.0–46.0)

## 2024-01-18 LAB — HEMOGLOBIN: Hemoglobin: 12.9 g/dL (ref 12.0–15.0)

## 2024-01-18 MED ORDER — CETIRIZINE HCL 10 MG PO TABS
10.0000 mg | ORAL_TABLET | Freq: Every day | ORAL | Status: DC
  Administered 2024-01-18: 10 mg via ORAL
  Filled 2024-01-18 (×2): qty 1

## 2024-01-18 MED ORDER — PANTOPRAZOLE SODIUM 40 MG IV SOLR
40.0000 mg | INTRAVENOUS | Status: DC
  Administered 2024-01-18: 40 mg via INTRAVENOUS
  Filled 2024-01-18: qty 10

## 2024-01-18 NOTE — Care Management Obs Status (Signed)
 MEDICARE OBSERVATION STATUS NOTIFICATION   Patient Details  Name: ELEONORA PEELER MRN: [id number] Date of Birth: December 11, 1955   Medicare Observation Status Notification Given:  Yes    Sonda Manuella Quill, RN 01/18/2024, 2:51 PM

## 2024-01-18 NOTE — Plan of Care (Signed)
  Problem: Clinical Measurements: Goal: Will remain free from infection Outcome: Progressing Goal: Diagnostic test results will improve Outcome: Progressing   Problem: Nutrition: Goal: Adequate nutrition will be maintained Outcome: Progressing   Problem: Safety: Goal: Ability to remain free from injury will improve Outcome: Progressing   

## 2024-01-18 NOTE — TOC Initial Note (Signed)
 Transition of Care [facility]) - Initial/Assessment Note    Patient Details  Name: Sylvia Hopkins MRN: [id number] Date of Birth: 04-02-1956  Transition of [facility]) CM/SW Contact:    Sonda Manuella Quill, RN Phone Number: 01/18/2024, 2:54 PM  Clinical Narrative:                 [place] w/ pt and spouse Raena Pau [phone]) in room; pt says she lives at home w/ her spouse; she plans to return at d/c; he will provide transportation; pt verified insurance/PCP; she denied SDOH risks; pt says she does not have DME, HH services, or home oxygen; TOC following.  Expected Discharge Plan: Home/Self Care Barriers to Discharge: Continued Medical Work up   Patient Goals and CMS Choice Patient states their goals for this hospitalization and ongoing recovery are:: home CMS Medicare.gov Compare Post Acute Care list provided to:: Patient   [place] ownership interest in [facility].provided to:: Patient    Expected Discharge Plan and Services   Discharge Planning Services: CM Consult   Living arrangements for the past 2 months: Single Family Home                                      Prior Living Arrangements/Services Living arrangements for the past 2 months: Single Family Home Lives with:: Spouse Patient language and need for interpreter reviewed:: Yes Do you feel safe going back to the place where you live?: Yes      Need for Family Participation in Patient Care: Yes (Comment) Care giver support system in place?: Yes (comment) Current home services:  (n/a) Criminal Activity/Legal Involvement Pertinent to Current Situation/Hospitalization: No - Comment as needed  Activities of Daily Living   ADL Screening (condition at time of admission) Independently performs ADLs?: Yes (appropriate for developmental age) Is the patient deaf or have difficulty hearing?: No Does the patient have difficulty seeing, even when wearing glasses/contacts?: No Does the patient have  difficulty concentrating, remembering, or making decisions?: No  Permission Sought/Granted Permission sought to share information with : Case Manager Permission granted to share information with : Yes, Verbal Permission Granted  Share Information with NAME: Case Manager     Permission granted to share info w Relationship: Barrie Sigmund (spouse) [phone]     Emotional Assessment Appearance:: Appears stated age Attitude/Demeanor/Rapport: Gracious Affect (typically observed): Accepting Orientation: : Oriented to Self, Oriented to Place, Oriented to  Time, Oriented to Situation Alcohol / Substance Use: Not Applicable Psych Involvement: No (comment)  Admission diagnosis:  Rectal bleeding [K62.5] Lower GI bleed [K92.2] Patient Active Problem List   Diagnosis Date Noted   Rectal bleeding 01/17/2024   Depression    Anxiety    Essential hypertension 06/16/2018   Class 1 obesity with serious comorbidity and body mass index (BMI) of 32.0 to 32.9 in adult 04/21/2018   Other fatigue 04/03/2018   Shortness of breath on exertion 04/03/2018   Gastroesophageal reflux disease 04/03/2018   Vitamin D  deficiency 04/03/2018   History of hypothyroidism 04/03/2018   Elevated blood pressure reading 04/03/2018   Acute diverticulitis 01/19/2015   Hypothyroidism 01/19/2015   Diverticulitis 01/17/2015   PCP:  Claudene Pellet, MD Pharmacy:   [facility] [phone] [address] [place], [place] - [address] AT [facility] [address] [place] [address] Phone: [phone] Fax: [phone]     Social Drivers  of Health (SDOH) Social History: SDOH Screenings   Food Insecurity: No Food Insecurity (01/18/2024)  Housing: Low Risk  (01/18/2024)  Transportation Needs: No Transportation Needs (01/18/2024)  Utilities: Not At Risk (01/18/2024)  Social Connections: Moderately Isolated (01/17/2024)  Tobacco Use: Low Risk  (01/18/2024)   SDOH Interventions: Food Insecurity  Interventions: Intervention Not Indicated, Inpatient TOC Housing Interventions: Intervention Not Indicated, Inpatient TOC Transportation Interventions: Intervention Not Indicated, Inpatient TOC Utilities Interventions: Intervention Not Indicated, Inpatient TOC   Readmission Risk Interventions     No data to display

## 2024-01-18 NOTE — Care Management Obs Status (Signed)
 MEDICARE OBSERVATION STATUS NOTIFICATION   Patient Details  Name: Sylvia Hopkins MRN: [id number] Date of Birth: 1955/11/22   Medicare Observation Status Notification Given:  Yes    Sonda Manuella Quill, RN 01/18/2024, 2:50 PM

## 2024-01-18 NOTE — Plan of Care (Signed)
  Problem: Nutrition: Goal: Adequate nutrition will be maintained Outcome: Progressing   Problem: Safety: Goal: Ability to remain free from injury will improve Outcome: Progressing   

## 2024-01-18 NOTE — Consult Note (Signed)
 Reason for Consult: Lower GI bleeding Referring Physician: Hospital team  Sylvia Hopkins is an 68 y.o. female.  HPI: Patient seen and examined and discussed with my partner Dr. Saintclair her usual gastroenterologist and her last colonoscopy 5 years ago was reviewed and she has not had any previous diverticular problems and her case discussed with her husband as well and she did take Excedrin a few days ago and rarely uses aspirin or nonsteroidals and has not had any change in bowel habits or any previous bleeding and currently she feels like her bleeding is decreasing and we answered all of their questions  Past Medical History:  Diagnosis Date   ADHD    Alcohol use    Anxiety    Arthritis    sholders. back, lt knee   Asthma due to environmental allergies    inhalers 2x a day   Back pain    Constipation    Depression    Diverticula of colon    Diverticulitis 2016   Fatigue    GERD (gastroesophageal reflux disease)    Hay fever    Headache    migraines with allergies   History of hiatal hernia    HTN (hypertension)    no meds. watching   Joint pain    Seasonal allergies    Shortness of breath on exertion    Swelling of extremity    Thyroid  disease    Vertigo 2022   Vision changes    Vitamin D  deficiency     Past Surgical History:  Procedure Laterality Date   CESAREAN SECTION     x2   COLONOSCOPY     HAND SURGERY     TONSILLECTOMY     TOTAL SHOULDER ARTHROPLASTY Right 12/08/2020   Procedure: TOTAL SHOULDER ARTHROPLASTY;  Surgeon: Melita Drivers, MD;  Location: [facility];  Service: Orthopedics;  Laterality: Right;     Family History  Problem Relation Age of Onset   Emphysema Mother    Depression Mother    Anxiety disorder Mother    Alcoholism Mother    Prostate cancer Father    Heart disease Father    Hyperlipidemia Father    Hypertension Father    Cancer Father    Hyperlipidemia Sister    Heart disease Brother    Hypertension Brother    Hepatitis C Child      Social History:  reports that she has never smoked. She has never used smokeless tobacco. She reports current alcohol use. She reports that she does not use drugs.  Allergies:  Allergies  Allergen Reactions   Advil [Ibuprofen] Other (See Comments)    Upset stomach  Nsaids   Ciprofloxacin Anaphylaxis, Other (See Comments) and Shortness Of Breath   Sulfamethoxazole-Trimethoprim Nausea And Vomiting, Itching, Rash and Shortness Of Breath   Aspirin     Other reaction(s): GI bleed    Medications: I have reviewed the patient's current medications.  Results for orders placed or performed during the hospital encounter of 01/17/24 (from the past 48 hours)  Comprehensive metabolic panel     Status: Abnormal   Collection Time: 01/17/24  6:12 PM  Result Value Ref Range   Sodium 134 (L) 135 - 145 mmol/L   Potassium 4.5 3.5 - 5.1 mmol/L   Chloride 98 98 - 111 mmol/L   CO2 26 22 - 32 mmol/L   Glucose, Bld 101 (H) 70 - 99 mg/dL    Comment: Glucose reference range applies only to samples taken after  fasting for at least 8 hours.   BUN 14 8 - 23 mg/dL   Creatinine, Ser 9.37 0.44 - 1.00 mg/dL   Calcium 9.4 8.9 - 89.6 mg/dL   Total Protein 7.2 6.5 - 8.1 g/dL   Albumin 4.8 3.5 - 5.0 g/dL   AST 18 15 - 41 U/L   ALT 17 0 - 44 U/L   Alkaline Phosphatase 38 38 - 126 U/L   Total Bilirubin 1.3 (H) 0.0 - 1.2 mg/dL   GFR, Estimated >39 >39 mL/min    Comment: (NOTE) Calculated using the CKD-EPI Creatinine Equation (2021)    Anion gap 10 5 - 15    Comment: Performed at [facility], [address], [place], [place] [address]  Lipase, blood     Status: None   Collection Time: 01/17/24  6:12 PM  Result Value Ref Range   Lipase 27 11 - 51 U/L    Comment: Performed at [facility], [address] [place], [place] [address]  CBC with Differential     Status: None   Collection Time: 01/17/24  6:12 PM  Result Value Ref Range   WBC 8.2 4.0 - 10.5 K/uL   RBC 4.48  3.87 - 5.11 MIL/uL   Hemoglobin 14.7 12.0 - 15.0 g/dL   HCT 57.0 63.9 - 53.9 %   MCV 95.8 80.0 - 100.0 fL   MCH 32.8 26.0 - 34.0 pg   MCHC 34.3 30.0 - 36.0 g/dL   RDW 86.9 88.4 - 84.4 %   Platelets 326 150 - 400 K/uL   nRBC 0.0 0.0 - 0.2 %   Neutrophils Relative % 65 %   Neutro Abs 5.5 1.7 - 7.7 K/uL   Lymphocytes Relative 22 %   Lymphs Abs 1.8 0.7 - 4.0 K/uL   Monocytes Relative 9 %   Monocytes Absolute 0.7 0.1 - 1.0 K/uL   Eosinophils Relative 2 %   Eosinophils Absolute 0.1 0.0 - 0.5 K/uL   Basophils Relative 1 %   Basophils Absolute 0.1 0.0 - 0.1 K/uL   Immature Granulocytes 1 %   Abs Immature Granulocytes 0.04 0.00 - 0.07 K/uL    Comment: Performed at [facility], [address], [place], [place] [address]  Protime-INR     Status: None   Collection Time: 01/17/24  6:12 PM  Result Value Ref Range   Prothrombin Time 13.5 11.4 - 15.2 seconds   INR 1.0 0.8 - 1.2    Comment: (NOTE) INR goal varies based on device and disease states. Performed at [facility], [address], [place], [place] [address]   Type and screen [facility]     Status: None   Collection Time: 01/17/24  6:12 PM  Result Value Ref Range   ABO/RH(D) O POS    Antibody Screen NEG    Sample Expiration      01/20/2024,2359 Performed at [facility], [address] [place], [place] [address]   Hemoglobin     Status: None   Collection Time: 01/17/24 10:07 PM  Result Value Ref Range   Hemoglobin 13.5 12.0 - 15.0 g/dL    Comment: Performed at [facility], [address] [place], [place] [address]  Hematocrit     Status: None   Collection Time: 01/17/24 10:07 PM  Result Value Ref Range   HCT 40.2 36.0 - 46.0 %    Comment: Performed at [facility], [address] [place], [place] [address]  HIV Antibody (routine testing w rflx)     Status: None   Collection Time: 01/18/24  5:30 AM   Result Value Ref Range   HIV Screen 4th Generation wRfx Non Reactive Non Reactive    Comment: Performed at [facility], [address] [place], [place] [address]  Basic metabolic panel     Status: Abnormal   Collection Time: 01/18/24  5:30 AM  Result Value Ref Range   Sodium 134 (L) 135 - 145 mmol/L   Potassium 3.8 3.5 - 5.1 mmol/L   Chloride 104 98 - 111 mmol/L   CO2 25 22 - 32 mmol/L   Glucose, Bld 101 (H) 70 - 99 mg/dL    Comment: Glucose reference range applies only to samples taken after fasting for at least 8 hours.   BUN 13 8 - 23 mg/dL   Creatinine, Ser 9.46 0.44 - 1.00 mg/dL   Calcium 8.4 (L) 8.9 - 10.3 mg/dL   GFR, Estimated >39 >39 mL/min    Comment: (NOTE) Calculated using the CKD-EPI Creatinine Equation (2021)    Anion gap 5 5 - 15    Comment: Performed at [facility], [address], [place], [place] [address]  Hemoglobin     Status: None   Collection Time: 01/18/24  5:30 AM  Result Value Ref Range   Hemoglobin 12.9 12.0 - 15.0 g/dL    Comment: Performed at [facility], [address], [place], [place] [address]  Hematocrit     Status: None   Collection Time: 01/18/24  5:30 AM  Result Value Ref Range   HCT 37.9 36.0 - 46.0 %    Comment: Performed at [facility], [address], [place], [place] [address]    No results found.  ROS negative except above Blood pressure 135/78, pulse 80, temperature 97.8 F (36.6 C), temperature source Oral, resp. rate 18, height 5' 4 (1.626 m), weight 85.7 kg, SpO2 94%. Physical Exam vital signs stable afebrile no acute distress abdomen is soft nontender BUN and creatinine normal hemoglobin drop from 14.7-12.9  Assessment/Plan: Certainly diverticular bleeding Plan: If bleeding increases would proceed with CTA and if positive consult IR and we talked about that versus a colonoscopy urgently and the difficulty in problems with both approaches and will continue  clear liquids today and please call me if signs of increased bleeding otherwise I will check on tomorrow  Rayshawn Maney E 01/18/2024, 12:07 PM

## 2024-01-18 NOTE — Care Management Obs Status (Signed)
 MEDICARE OBSERVATION STATUS NOTIFICATION   Patient Details  Name: JAQUAYA COYLE MRN: [id number] Date of Birth: 10/31/1955   Medicare Observation Status Notification Given:  Yes    Sonda Manuella Quill, RN 01/18/2024, 2:56 PM

## 2024-01-18 NOTE — Progress Notes (Signed)
 PROGRESS NOTE    Sylvia Hopkins  FMW:[id number] DOB: 08-20-1955 DOA: 01/17/2024 PCP: Claudene Pellet, MD   Brief Narrative:  68 y.o. female with medical history significant for hypertension, hypothyroidism, anxiety, ADHD, asthma, and diverticulosis presents with rectal bleeding.  On presentation, hemoglobin was normal.  She was given IV Protonix .  ED provider spoke to Dr. Rosalie who recommended that patient can be discharged in the morning if symptoms resolve overnight and if hemoglobin remained stable.  Patient had 2 episodes of rectal bleeding again this morning.  Assessment & Plan:   Possible lower GI bleeding presenting with painless hematochezia -On presentation, hemoglobin was normal.  She was given IV Protonix .  ED provider spoke to Dr. Rosalie who recommended that patient can be discharged in the morning if symptoms resolve overnight and if hemoglobin remained stable.  Patient had 2 episodes of rectal bleeding again this morning. - Patient is requesting GI evaluation.  I have requested Dr. Rosalie to see this patient. - Monitor H&H.  Will start IV Protonix  as she is having some upper GI symptoms as well.  Asthma - Stable.  Continue current inhaled regimen  Anxiety -Continue as needed Xanax   Obesity class I -Outpatient follow-up   DVT prophylaxis: SCDs Code Status: Full Family Communication: Husband at bedside Disposition Plan: Status is: Observation The patient will require care spanning > 2 midnights and should be moved to inpatient because: Of severity of illness.  Continues to have rectal bleeding.    Consultants: GI  Procedures: None  Antimicrobials: None   Subjective: Patient seen and examined at bedside.  Had few episodes of rectal bleeding this morning.  No fever, vomiting, worsening abdominal pain reported.  Objective: Vitals:   01/17/24 2144 01/18/24 0149 01/18/24 0542 01/18/24 1002  BP: (!) 143/97 134/85 (!) 151/91 132/83  Pulse: 81 81 78 79  Resp: 20  18 20 18   Temp: 98.1 F (36.7 C) (!) 97.5 F (36.4 C) 97.9 F (36.6 C) 98 F (36.7 C)  TempSrc:      SpO2: 99% 96% 96% 95%  Weight:      Height:        Intake/Output Summary (Last 24 hours) at 01/18/2024 1117 Last data filed at 01/18/2024 0600 Gross per 24 hour  Intake 1087.01 ml  Output --  Net 1087.01 ml   Filed Weights   01/17/24 2006  Weight: 85.7 kg    Examination:  General exam: Appears calm and comfortable  Respiratory system: Bilateral decreased breath sounds at bases Cardiovascular system: S1 & S2 heard, Rate controlled Gastrointestinal system: Abdomen is nondistended, soft and nontender. Normal bowel sounds heard. Extremities: No cyanosis, clubbing, edema  Central nervous system: Alert and oriented. No focal neurological deficits. Moving extremities Skin: No rashes, lesions or ulcers Psychiatry: Judgement and insight appear normal. Mood & affect appropriate.     Data Reviewed: I have personally reviewed following labs and imaging studies  CBC: Recent Labs  Lab 01/17/24 1812 01/17/24 2207 01/18/24 0530  WBC 8.2  --   --   NEUTROABS 5.5  --   --   HGB 14.7 13.5 12.9  HCT 42.9 40.2 37.9  MCV 95.8  --   --   PLT 326  --   --    Basic Metabolic Panel: Recent Labs  Lab 01/17/24 1812 01/18/24 0530  NA 134* 134*  K 4.5 3.8  CL 98 104  CO2 26 25  GLUCOSE 101* 101*  BUN 14 13  CREATININE 0.62 0.53  CALCIUM  9.4 8.4*   GFR: Estimated Creatinine Clearance: 70.3 mL/min (by C-G formula based on SCr of 0.53 mg/dL). Liver Function Tests: Recent Labs  Lab 01/17/24 1812  AST 18  ALT 17  ALKPHOS 38  BILITOT 1.3*  PROT 7.2  ALBUMIN 4.8   Recent Labs  Lab 01/17/24 1812  LIPASE 27   No results for input(s): AMMONIA in the last 168 hours. Coagulation Profile: Recent Labs  Lab 01/17/24 1812  INR 1.0   Cardiac Enzymes: No results for input(s): CKTOTAL, CKMB, CKMBINDEX, TROPONINI in the last 168 hours. BNP (last 3 results) No  results for input(s): PROBNP in the last 8760 hours. HbA1C: No results for input(s): HGBA1C in the last 72 hours. CBG: No results for input(s): GLUCAP in the last 168 hours. Lipid Profile: No results for input(s): CHOL, HDL, LDLCALC, TRIG, CHOLHDL, LDLDIRECT in the last 72 hours. Thyroid  Function Tests: No results for input(s): TSH, T4TOTAL, FREET4, T3FREE, THYROIDAB in the last 72 hours. Anemia Panel: No results for input(s): VITAMINB12, FOLATE, FERRITIN, TIBC, IRON, RETICCTPCT in the last 72 hours. Sepsis Labs: No results for input(s): PROCALCITON, LATICACIDVEN in the last 168 hours.  No results found for this or any previous visit (from the past 240 hours).       Radiology Studies: No results found.      Scheduled Meds:  fluticasone  furoate-vilanterol  1 puff Inhalation Daily   sodium chloride  flush  3 mL Intravenous Q12H   Continuous Infusions:        Sophie Mao, MD [facility] 01/18/2024, 11:17 AM

## 2024-01-19 DIAGNOSIS — K922 Gastrointestinal hemorrhage, unspecified: Secondary | ICD-10-CM | POA: Diagnosis not present

## 2024-01-19 DIAGNOSIS — K625 Hemorrhage of anus and rectum: Secondary | ICD-10-CM | POA: Diagnosis not present

## 2024-01-19 LAB — CBC WITH DIFFERENTIAL/PLATELET
Abs Immature Granulocytes: 0.02 K/uL (ref 0.00–0.07)
Basophils Absolute: 0.1 K/uL (ref 0.0–0.1)
Basophils Relative: 1 %
Eosinophils Absolute: 0.2 K/uL (ref 0.0–0.5)
Eosinophils Relative: 3 %
HCT: 38.9 % (ref 36.0–46.0)
Hemoglobin: 13 g/dL (ref 12.0–15.0)
Immature Granulocytes: 0 %
Lymphocytes Relative: 28 %
Lymphs Abs: 1.7 K/uL (ref 0.7–4.0)
MCH: 32.3 pg (ref 26.0–34.0)
MCHC: 33.4 g/dL (ref 30.0–36.0)
MCV: 96.5 fL (ref 80.0–100.0)
Monocytes Absolute: 0.5 K/uL (ref 0.1–1.0)
Monocytes Relative: 8 %
Neutro Abs: 3.6 K/uL (ref 1.7–7.7)
Neutrophils Relative %: 60 %
Platelets: 313 K/uL (ref 150–400)
RBC: 4.03 MIL/uL (ref 3.87–5.11)
RDW: 12.9 % (ref 11.5–15.5)
WBC: 6 K/uL (ref 4.0–10.5)
nRBC: 0 % (ref 0.0–0.2)

## 2024-01-19 LAB — BASIC METABOLIC PANEL WITH GFR
Anion gap: 7 (ref 5–15)
BUN: 7 mg/dL — ABNORMAL LOW (ref 8–23)
CO2: 23 mmol/L (ref 22–32)
Calcium: 8.7 mg/dL — ABNORMAL LOW (ref 8.9–10.3)
Chloride: 106 mmol/L (ref 98–111)
Creatinine, Ser: 0.49 mg/dL (ref 0.44–1.00)
GFR, Estimated: >60 mL/min (ref >60)
Glucose, Bld: 109 mg/dL — ABNORMAL HIGH (ref 70–99)
Potassium: 3.8 mmol/L (ref 3.5–5.1)
Sodium: 136 mmol/L (ref 135–145)

## 2024-01-19 LAB — MAGNESIUM: Magnesium: 2.2 mg/dL (ref 1.7–2.4)

## 2024-01-19 MED ORDER — AMPHETAMINE-DEXTROAMPHETAMINE 30 MG PO TABS: 30.0000 mg | ORAL_TABLET | Freq: Every day | ORAL | Status: AC

## 2024-01-19 NOTE — Discharge Summary (Addendum)
 Physician Discharge Summary  Sylvia Hopkins FMW:[id number] DOB: 07-Feb-1956 DOA: 01/17/2024  PCP: Claudene Pellet, MD  Admit date: 01/17/2024 Discharge date: 01/19/2024  Admitted From: Home Disposition: Home  Recommendations for Outpatient Follow-up:  Follow up with PCP in 1 week with repeat CBC/BMP Outpatient follow-up with GI Follow up in ED if symptoms worsen or new appear   Home Health: No Equipment/Devices: None  Discharge Condition: Stable CODE STATUS: Full Diet recommendation: Heart healthy  Brief/Interim Summary: 68 y.o. female with medical history significant for hypertension, hypothyroidism, anxiety, ADHD, asthma, and diverticulosis presents with rectal bleeding.  On presentation, hemoglobin was normal.  She was given IV Protonix .  ED provider spoke to Dr. Rosalie who recommended that patient can be discharged in the morning if symptoms resolve overnight and if hemoglobin remained stable.  Patient had more rectal bleeding since admission.  GI was consulted.  GI recommended conservative management with close monitoring.  Subsequently, her rectal bleeding has stopped.  Hemoglobin has remained stable.  GI has advance the diet and cleared the patient for discharge.  Discharge by home today with outpatient follow-up with PCP and GI.  Discharge Diagnoses:   Possible lower GI bleeding presenting with painless hematochezia -On presentation, hemoglobin was normal.  - Hemoglobin has remained normal since admission.  Rectal bleeding has stopped.  GI evaluation and follow-up appreciated: Recommended conservative management.  GI has advance the diet and cleared the patient for discharge.  Outpatient follow-up with PCP and GI.   Asthma - Stable.  Continue current inhaled regimen   Anxiety -Continue as needed Xanax    Obesity class I -Outpatient follow-up  Hyponatremia - Resolved  Vaginal bleeding - Patient had a recent vaginal ultrasound and GYN eval.  Had some vaginal bleeding  during the hospitalization which was stopped.  Outpatient follow-up with GYN.     Discharge Instructions  Discharge Instructions     Diet general   Complete by: As directed    Soft diet   Increase activity slowly   Complete by: As directed       Allergies as of 01/19/2024       Reactions   Advil [ibuprofen] Other (See Comments)   Upset stomach  Nsaids   Ciprofloxacin Anaphylaxis, Other (See Comments), Shortness Of Breath   Sulfamethoxazole-trimethoprim Nausea And Vomiting, Itching, Rash, Shortness Of Breath   Aspirin    Other reaction(s): GI bleed        Medication List     TAKE these medications    albuterol  108 (90 Base) MCG/ACT inhaler Commonly known as: VENTOLIN  HFA Inhale 2 puffs into the lungs every 6 (six) hours as needed for wheezing or shortness of breath.   ALPRAZolam  0.5 MG tablet Commonly known as: XANAX  Take 0.5 mg by mouth daily as needed for anxiety.   amphetamine -dextroamphetamine  30 MG tablet Commonly known as: ADDERALL Take 1 tablet by mouth daily.   azelastine 0.1 % nasal spray Commonly known as: ASTELIN Place 1 spray into both nostrils daily as needed for allergies. Use in each nostril as directed   budesonide-formoterol 80-4.5 MCG/ACT inhaler Commonly known as: SYMBICORT Inhale 2 puffs into the lungs 2 (two) times daily.   diphenhydrAMINE 25 MG tablet Commonly known as: BENADRYL Take 25 mg by mouth every 6 (six) hours as needed for allergies or sleep.   estradiol 0.1 MG/24HR patch Commonly known as: VIVELLE-DOT Place 1 patch onto the skin 2 (two) times a week.   estradiol 0.1 MG/GM vaginal cream Commonly known as: ESTRACE Place 1  Applicatorful vaginally once a week.   famotidine 20 MG tablet Commonly known as: PEPCID Take 20 mg by mouth daily as needed (Acid reflux).   GenTeal Tears PF 0.1-0.3 % Soln Generic drug: Dextran 70-Hypromellose (PF) Place 1 drop into both eyes at bedtime.   levocetirizine 5 MG tablet Commonly  known as: XYZAL Take 5 mg by mouth daily as needed for allergies.   mometasone-formoterol 100-5 MCG/ACT Aero Commonly known as: DULERA Inhale 2 puffs into the lungs 2 (two) times daily.   Opcon-A 0.027-0.315 % Soln Generic drug: Naphazoline-Pheniramine Place 1 drop into both eyes daily as needed (Allergies).   OVER THE COUNTER MEDICATION Take 1-2 capsules by mouth as needed (Congestion/Inflammation). SUPER QUERCETIN   progesterone 100 MG capsule Commonly known as: PROMETRIUM Take 100 mg by mouth every other day.        Follow-up Information     Claudene Pellet, MD. Schedule an appointment as soon as possible for a visit in 1 week(s).   Specialty: Family Medicine Why: with repeat cbc/bmp Contact information: [address] [place] [address] [phone]                Allergies  Allergen Reactions   Advil [Ibuprofen] Other (See Comments)    Upset stomach  Nsaids   Ciprofloxacin Anaphylaxis, Other (See Comments) and Shortness Of Breath   Sulfamethoxazole-Trimethoprim Nausea And Vomiting, Itching, Rash and Shortness Of Breath   Aspirin     Other reaction(s): GI bleed    Consultations: GI   Procedures/Studies: No results found.    Subjective: Patient seen and examined at bedside.  Wants to go home today.  Has not had any more rectal bleeding overnight.  Denies any worsening abdominal pain, fever or vomiting.  Discharge Exam: Vitals:   01/18/24 1928 01/19/24 0519  BP: 136/83 128/88  Pulse: 79 85  Resp: 16 16  Temp: 97.8 F (36.6 C) 97.8 F (36.6 C)  SpO2: 98% 95%    General: Pt is alert, awake, not in acute distress Cardiovascular: rate controlled, S1/S2 + Respiratory: bilateral decreased breath sounds at bases Abdominal: Soft, obese, NT, ND, bowel sounds + Extremities: no edema, no cyanosis    The results of significant diagnostics from this hospitalization (including imaging, microbiology, ancillary and laboratory) are  listed below for reference.     Microbiology: No results found for this or any previous visit (from the past 240 hours).   Labs: BNP (last 3 results) No results for input(s): BNP in the last 8760 hours. Basic Metabolic Panel: Recent Labs  Lab 01/17/24 1812 01/18/24 0530 01/19/24 0752  NA 134* 134* 136  K 4.5 3.8 3.8  CL 98 104 106  CO2 26 25 23   GLUCOSE 101* 101* 109*  BUN 14 13 7*  CREATININE 0.62 0.53 0.49  CALCIUM 9.4 8.4* 8.7*  MG  --   --  2.2   Liver Function Tests: Recent Labs  Lab 01/17/24 1812  AST 18  ALT 17  ALKPHOS 38  BILITOT 1.3*  PROT 7.2  ALBUMIN 4.8   Recent Labs  Lab 01/17/24 1812  LIPASE 27   No results for input(s): AMMONIA in the last 168 hours. CBC: Recent Labs  Lab 01/17/24 1812 01/17/24 2207 01/18/24 0530 01/18/24 1829 01/19/24 0752  WBC 8.2  --   --   --  6.0  NEUTROABS 5.5  --   --   --  3.6  HGB 14.7 13.5 12.9 12.1 13.0  HCT 42.9 40.2  37.9 36.6 38.9  MCV 95.8  --   --   --  96.5  PLT 326  --   --   --  313   Cardiac Enzymes: No results for input(s): CKTOTAL, CKMB, CKMBINDEX, TROPONINI in the last 168 hours. BNP: Invalid input(s): POCBNP CBG: No results for input(s): GLUCAP in the last 168 hours. D-Dimer No results for input(s): DDIMER in the last 72 hours. Hgb A1c No results for input(s): HGBA1C in the last 72 hours. Lipid Profile No results for input(s): CHOL, HDL, LDLCALC, TRIG, CHOLHDL, LDLDIRECT in the last 72 hours. Thyroid  function studies No results for input(s): TSH, T4TOTAL, T3FREE, THYROIDAB in the last 72 hours.  Invalid input(s): FREET3 Anemia work up No results for input(s): VITAMINB12, FOLATE, FERRITIN, TIBC, IRON, RETICCTPCT in the last 72 hours. Urinalysis    Component Value Date/Time   COLORURINE YELLOW 01/16/2015 2034   APPEARANCEUR CLEAR 01/16/2015 2034   LABSPEC 1.008 01/16/2015 2034   PHURINE 7.0 01/16/2015 2034   GLUCOSEU NEGATIVE  01/16/2015 2034   HGBUR NEGATIVE 01/16/2015 2034   BILIRUBINUR NEGATIVE 01/16/2015 2034   KETONESUR 15 (A) 01/16/2015 2034   PROTEINUR NEGATIVE 01/16/2015 2034   UROBILINOGEN 0.2 01/16/2015 2034   NITRITE NEGATIVE 01/16/2015 2034   LEUKOCYTESUR NEGATIVE 01/16/2015 2034   Sepsis Labs Recent Labs  Lab 01/17/24 1812 01/19/24 0752  WBC 8.2 6.0   Microbiology No results found for this or any previous visit (from the past 240 hours).   Time coordinating discharge: 35 minutes  SIGNED:   Sophie Mao, MD  [facility] 01/19/2024, 10:58 AM

## 2024-01-19 NOTE — Progress Notes (Signed)
 [place] DELENA [place] 9:00 AM  Subjective: Patient seen and examined and case discussed with her husband and she has had no further bleeding or bowel movements no abdominal pain and no complaints and wants to go home and case discussed with the hospital team  Objective: Vital signs stable afebrile no acute distress abdomen is soft nontender BUN and creatinine okay hemoglobin okay  Assessment: Seemingly resolved diverticular bleeding  Plan: Will allow soft solid breakfast and if no signs of bleeding can go home midday and follow-up with her primary gastroenterologist as needed or in 1 month for repeat colonoscopy as scheduled and we answered all of her and her husband's questions  [facility]  office [phone] After 5PM or if no answer call [phone]

## 2024-01-19 NOTE — TOC Transition Note (Signed)
 Transition of Care [facility]) - Discharge Note   Patient Details  Name: BRILEY BUMGARNER MRN: [id number] Date of Birth: 09-14-1955  [facility]) CM/SW Contact:  Sonda Manuella Quill, RN Phone Number: 01/19/2024, 11:00 AM   Clinical Narrative:    D/C orders received; no TOC needs.   Final next level of care: Home/Self Care Barriers to Discharge: No Barriers Identified   Patient Goals and CMS Choice Patient states their goals for this hospitalization and ongoing recovery are:: home CMS Medicare.gov Compare Post Acute Care list provided to:: Patient   [place] ownership interest in [facility]provided to:: Patient    Discharge Placement                       Discharge Plan and Services Additional resources added to the After Visit Summary for     Discharge Planning Services: CM Consult                                 Social Drivers of Health (SDOH) Interventions SDOH Screenings   Food Insecurity: No Food Insecurity (01/18/2024)  Housing: Low Risk  (01/18/2024)  Transportation Needs: No Transportation Needs (01/18/2024)  Utilities: Not At Risk (01/18/2024)  Social Connections: Moderately Isolated (01/17/2024)  Tobacco Use: Low Risk  (01/18/2024)     Readmission Risk Interventions     No data to display

## 2024-01-22 ENCOUNTER — Ambulatory Visit
Admission: RE | Admit: 2024-01-22 | Discharge: 2024-01-22 | Disposition: A | Discharge status: Home / Self Care | Source: Ambulatory Visit | Attending: Gastroenterology | Admitting: Gastroenterology

## 2024-01-22 DIAGNOSIS — R1011 Right upper quadrant pain: Secondary | ICD-10-CM

## 2024-07-31 ENCOUNTER — Encounter: Payer: Self-pay | Admitting: Cardiovascular Disease

## 2024-07-31 ENCOUNTER — Ambulatory Visit: Attending: Cardiovascular Disease | Admitting: Cardiovascular Disease

## 2024-07-31 VITALS — BP 130/90 | HR 76 | Ht 64.0 in | Wt 191.6 lb

## 2024-07-31 DIAGNOSIS — R0609 Other forms of dyspnea: Secondary | ICD-10-CM | POA: Insufficient documentation

## 2024-07-31 DIAGNOSIS — I2089 Other forms of angina pectoris: Secondary | ICD-10-CM | POA: Diagnosis not present

## 2024-07-31 DIAGNOSIS — I10 Essential (primary) hypertension: Secondary | ICD-10-CM | POA: Insufficient documentation

## 2024-07-31 MED ORDER — METOPROLOL TARTRATE 100 MG PO TABS: 100.0000 mg | ORAL_TABLET | Freq: Once | ORAL | 0 refills | Status: AC

## 2024-07-31 NOTE — Assessment & Plan Note (Addendum)
 As above, her symptoms are concerning for exertional angina. Check coronary CTA.

## 2024-07-31 NOTE — Assessment & Plan Note (Addendum)
 Blood pressure mildly elevated today.  I asked her to obtain a home blood pressure cuff, explained how to check blood pressure properly, and asked her to obtain readings 3 days/week and send them in to see if she will require antihypertensive therapy.

## 2024-07-31 NOTE — Patient Instructions (Addendum)
 Medication Instructions:  ONCE Metoprolol Tartrate (Lopressor) 100 mg once 2 hours prior to coronary CTA scan  **Make sure to get a home blood pressure cuff to monitor readings**  *If you need a refill on your cardiac medications before your next appointment, please call your pharmacy*  Lab Work: None ordered today. If you have labs (blood work) drawn today and your tests are completely normal, you will receive your results only by: MyChart Message (if you have MyChart) OR A paper copy in the mail If you have any lab test that is abnormal or we need to change your treatment, we will call you to review the results.  Testing/Procedures: Your provider has requested that you have a coronary CTA scan. Non-Cardiac CT Angiography (CTA), is a special type of CT scan that uses a computer to produce multi-dimensional views of major blood vessels throughout the body. In CT angiography, a contrast material is injected through an IV to help visualize the blood vessels  Your physician has requested that you have an echocardiogram. Echocardiography is a painless test that uses sound waves to create images of your heart. It provides your doctor with information about the size and shape of your heart and how well your hearts chambers and valves are working. This procedure takes approximately one hour. There are no restrictions for this procedure. Please do NOT wear cologne, perfume, aftershave, or lotions (deodorant is allowed). Please arrive 15 minutes prior to your appointment time.  Please note: We ask at that you not bring children with you during ultrasound (echo/ vascular) testing. Due to room size and safety concerns, children are not allowed in the ultrasound rooms during exams. Our front office staff cannot provide observation of children in our lobby area while testing is being conducted. An adult accompanying a patient to their appointment will only be allowed in the ultrasound room at the discretion  of the ultrasound technician under special circumstances. We apologize for any inconvenience.   Follow-Up: At [facility], you and your health needs are our priority.  As part of our continuing mission to provide you with exceptional heart care, our providers are all part of one team.  This team includes your primary Cardiologist (physician) and Advanced Practice Providers or APPs (Physician Assistants and Nurse Practitioners) who all work together to provide you with the care you need, when you need it.  Your next appointment:   1 year(s)  Provider:   Ozell Fell, MD    We recommend signing up for the patient portal called MyChart.  Sign up information is provided on this After Visit Summary.  MyChart is used to connect with patients for Virtual Visits (Telemedicine).  Patients are able to view lab/test results, encounter notes, upcoming appointments, etc.  Non-urgent messages can be sent to your provider as well.   To learn more about what you can do with MyChart, go to [email or web address].   Other Instructions   Your cardiac CT will be scheduled at one of the below locations:   [facility] [address] [place], [place] [address] [phone]   Please follow these instructions carefully (unless otherwise directed):  An IV will be required for this test and Nitroglycerin will be given.   On the Night Before the Test: Be sure to Drink plenty of water . Do not consume any caffeinated/decaffeinated beverages or chocolate 12 hours prior to your test. Do not take any antihistamines 12 hours prior to your test.  On the Day of the Test:  Drink plenty of water  until 1 hour prior to the test. Do not eat any food 1 hour prior to test. You may take your regular medications prior to the test.  Take metoprolol (Lopressor) 100 mg two hours prior to test. FEMALES- please wear underwire-free bra if available, avoid dresses & tight clothing       After the  Test: Drink plenty of water . After receiving IV contrast, you may experience a mild flushed feeling. This is normal. On occasion, you may experience a mild rash up to 24 hours after the test. This is not dangerous. If this occurs, you can take Benadryl 25 mg and increase your fluid intake. If you experience trouble breathing, this can be serious. If it is severe call 911 IMMEDIATELY. If it is mild, please call our office.  We will call to schedule your test 2-4 weeks out understanding that some insurance companies will need an authorization prior to the service being performed.   For more information and frequently asked questions, please visit our website : [email or web address]  For non-scheduling related questions, please contact the cardiac imaging nurse navigator should you have any questions/concerns: Cardiac Imaging Nurse Navigators Direct Office Dial: [phone]   For scheduling needs, including cancellations and rescheduling, please call Brittany, [phone].

## 2024-07-31 NOTE — Progress Notes (Signed)
 " Cardiology Office Note:    Date:  07/31/2024   ID:  Sylvia Hopkins, DOB [birth year], MRN [id number]  PCP:  Claudene Pellet, MD   [facility] Providers Cardiologist:  Ozell Fell, MD     Referring MD: Claudene Pellet, MD   Chief Complaint  Patient presents with   Shortness of Breath    History of Present Illness:    Sylvia Hopkins is a 69 y.o. female presenting for evaluation of shortness of breath. She is self-referred. She is concerned about dyspnea with exertion and fatigue. She's here with her husband today. They walk a mile most days. She has also had a lot of loss in her life this year. She has lost 2 siblings from heart disease. Her father had coronary stents. Her sister had a clotting disorder and also had vascular disease.   With walking, her 'stamina' is decreased. She sometimes feels a pressure like sensation in the chest but breathlessness is her main limitation.  She denies orthopnea or PND, but has a lot of trouble with reflux when she lies flat.  She usually sleeps for a few hours in a recliner and then moves into bed.  She occasionally has ankle edema but this has not been a major problem for her.  Past Medical History:  Diagnosis Date   ADHD    Alcohol use    Anxiety    Arthritis    sholders. back, lt knee   Asthma due to environmental allergies    inhalers 2x a day   Back pain    Constipation    Depression    Diverticula of colon    Diverticulitis 2016   Fatigue    GERD (gastroesophageal reflux disease)    Hay fever    Headache    migraines with allergies   History of hiatal hernia    HTN (hypertension)    no meds. watching   Joint pain    Seasonal allergies    Shortness of breath on exertion    Swelling of extremity    Thyroid  disease    Vertigo 2022   Vision changes    Vitamin D  deficiency    Past Surgical History:  Procedure Laterality Date   CESAREAN SECTION     x2   COLONOSCOPY     HAND SURGERY     TONSILLECTOMY     TOTAL  SHOULDER ARTHROPLASTY Right 12/08/2020   Procedure: TOTAL SHOULDER ARTHROPLASTY;  Surgeon: Melita Drivers, MD;  Location: [facility];  Service: Orthopedics;  Laterality: Right;     Current Medications: Active Medications[1]   Allergies:   Advil [ibuprofen], Ciprofloxacin, Sulfamethoxazole-trimethoprim, and Aspirin   ROS:   Please see the history of present illness.    Acid reflux (sleeps in recliner). All other systems reviewed and are negative.  EKGs/Labs/Other Studies Reviewed:    The following studies were reviewed today:     EKG:   EKG Interpretation Date/Time:  Friday July 31 2024 09:40:00 EST Ventricular Rate:  76 PR Interval:  158 QRS Duration:  80 QT Interval:  386 QTC Calculation: 434 R Axis:   -21  Text Interpretation: Normal sinus rhythm Normal ECG When compared with ECG of 25-Nov-2020 11:14, Nonspecific T wave abnormality has replaced inverted T waves in Inferior leads Nonspecific T wave abnormality, improved in Anterior leads T wave amplitude has decreased in Lateral leads Confirmed by Fell Ozell [phone]) on 07/31/2024 9:56:35 AM    Recent Labs: 01/17/2024: ALT 17 01/19/2024: BUN 7;  Creatinine, Ser 0.49; Hemoglobin 13.0; Magnesium 2.2; Platelets 313; Potassium 3.8; Sodium 136  Recent Lipid Panel    Component Value Date/Time   CHOL 180 06/11/2018 0843   TRIG 222 (H) 06/11/2018 0843   HDL 38 (L) 06/11/2018 0843   LDLCALC 98 06/11/2018 0843        Physical Exam:    VS:  BP (!) 130/90 (BP Location: Left Arm, Patient Position: Sitting, Cuff Size: Normal)   Pulse 76   Ht 5' 4 (1.626 m)   Wt 191 lb 9.6 oz (86.9 kg)   SpO2 95%   BMI 32.89 kg/m     Wt Readings from Last 3 Encounters:  07/31/24 191 lb 9.6 oz (86.9 kg)  01/17/24 189 lb (85.7 kg)  12/08/20 200 lb (90.7 kg)     GEN:  Well nourished, well developed in no acute distress HEENT: Normal NECK: No JVD; No carotid bruits LYMPHATICS: No lymphadenopathy CARDIAC: RRR, no murmurs, rubs,  gallops RESPIRATORY:  Clear to auscultation without rales, wheezing or rhonchi  ABDOMEN: Soft, non-tender, non-distended MUSCULOSKELETAL:  No edema; No deformity  SKIN: Warm and dry NEUROLOGIC:  Alert and oriented x 3 PSYCHIATRIC:  Normal affect   Assessment & Plan Dyspnea on exertion Patient has a history of asthma, but has not had recent issues with wheezing.  Difficult to know if her symptoms are pulmonary, cardiac, or deconditioning related.  With her family history of CAD and symptoms of both chest pressure and shortness of breath, progressive in nature, further cardiac testing is clearly indicated.  I have recommended a 2D echocardiogram to assess for systolic and diastolic dysfunction as well as the presence of any valvular heart disease.  I have recommended a gated coronary CTA to assess for the presence of ischemic heart disease as a potential etiology of her worsening functional limitation. Essential hypertension Blood pressure mildly elevated today.  I asked her to obtain a home blood pressure cuff, explained how to check blood pressure properly, and asked her to obtain readings 3 days/week and send them in to see if she will require antihypertensive therapy. Exertional angina As above, her symptoms are concerning for exertional angina. Check coronary CTA.  I will plan to see the patient back in 1 year for follow-up evaluation.  She is asked to send in her blood pressure readings after a few weeks.  Will review her echo and coronary CTA when completed.  If she demonstrates presence of coronary atherosclerosis, lipid-lowering therapy would be indicated.  Will await test results and blood pressure readings before making a decision on medical therapy.     Medication Adjustments/Labs and Tests Ordered: Current medicines are reviewed at length with the patient today.  Concerns regarding medicines are outlined above.  Orders Placed This Encounter  Procedures   CT CORONARY MORPH W/CTA COR  W/SCORE W/CA W/CM &/OR WO/CM   EKG 12-Lead   ECHOCARDIOGRAM COMPLETE   Meds ordered this encounter  Medications   metoprolol tartrate (LOPRESSOR) 100 MG tablet    Sig: Take 1 tablet (100 mg total) by mouth once. Take 90-120 minutes prior to scan. Hold for SBP less than 110.    Dispense:  1 tablet    Refill:  0    Patient Instructions  Medication Instructions:  ONCE Metoprolol Tartrate (Lopressor) 100 mg once 2 hours prior to coronary CTA scan  **Make sure to get a home blood pressure cuff to monitor readings**  *If you need a refill on your cardiac medications before your next  appointment, please call your pharmacy*  Lab Work: None ordered today. If you have labs (blood work) drawn today and your tests are completely normal, you will receive your results only by: MyChart Message (if you have MyChart) OR A paper copy in the mail If you have any lab test that is abnormal or we need to change your treatment, we will call you to review the results.  Testing/Procedures: Your provider has requested that you have a coronary CTA scan. Non-Cardiac CT Angiography (CTA), is a special type of CT scan that uses a computer to produce multi-dimensional views of major blood vessels throughout the body. In CT angiography, a contrast material is injected through an IV to help visualize the blood vessels  Your physician has requested that you have an echocardiogram. Echocardiography is a painless test that uses sound waves to create images of your heart. It provides your doctor with information about the size and shape of your heart and how well your hearts chambers and valves are working. This procedure takes approximately one hour. There are no restrictions for this procedure. Please do NOT wear cologne, perfume, aftershave, or lotions (deodorant is allowed). Please arrive 15 minutes prior to your appointment time.  Please note: We ask at that you not bring children with you during ultrasound  (echo/ vascular) testing. Due to room size and safety concerns, children are not allowed in the ultrasound rooms during exams. Our front office staff cannot provide observation of children in our lobby area while testing is being conducted. An adult accompanying a patient to their appointment will only be allowed in the ultrasound room at the discretion of the ultrasound technician under special circumstances. We apologize for any inconvenience.   Follow-Up: At [facility], you and your health needs are our priority.  As part of our continuing mission to provide you with exceptional heart care, our providers are all part of one team.  This team includes your primary Cardiologist (physician) and Advanced Practice Providers or APPs (Physician Assistants and Nurse Practitioners) who all work together to provide you with the care you need, when you need it.  Your next appointment:   1 year(s)  Provider:   Ozell Fell, MD    We recommend signing up for the patient portal called MyChart.  Sign up information is provided on this After Visit Summary.  MyChart is used to connect with patients for Virtual Visits (Telemedicine).  Patients are able to view lab/test results, encounter notes, upcoming appointments, etc.  Non-urgent messages can be sent to your provider as well.   To learn more about what you can do with MyChart, go to [email or web address].   Other Instructions   Your cardiac CT will be scheduled at one of the below locations:   [facility] [address] [place], [place] [address] [phone]   Please follow these instructions carefully (unless otherwise directed):  An IV will be required for this test and Nitroglycerin will be given.   On the Night Before the Test: Be sure to Drink plenty of water . Do not consume any caffeinated/decaffeinated beverages or chocolate 12 hours prior to your test. Do not take any antihistamines 12 hours prior to your  test.  On the Day of the Test: Drink plenty of water  until 1 hour prior to the test. Do not eat any food 1 hour prior to test. You may take your regular medications prior to the test.  Take metoprolol (Lopressor) 100 mg two hours prior to test.  FEMALES- please wear underwire-free bra if available, avoid dresses & tight clothing       After the Test: Drink plenty of water . After receiving IV contrast, you may experience a mild flushed feeling. This is normal. On occasion, you may experience a mild rash up to 24 hours after the test. This is not dangerous. If this occurs, you can take Benadryl 25 mg and increase your fluid intake. If you experience trouble breathing, this can be serious. If it is severe call 911 IMMEDIATELY. If it is mild, please call our office.  We will call to schedule your test 2-4 weeks out understanding that some insurance companies will need an authorization prior to the service being performed.   For more information and frequently asked questions, please visit our website : [email or web address]  For non-scheduling related questions, please contact the cardiac imaging nurse navigator should you have any questions/concerns: Cardiac Imaging Nurse Navigators Direct Office Dial: [phone]   For scheduling needs, including cancellations and rescheduling, please call Brittany, [phone].     Signed, Ozell Fell, MD  07/31/2024 1:20 PM    [facility]     [1]  Current Meds  Medication Sig   albuterol  (PROVENTIL  HFA;VENTOLIN  HFA) 108 (90 BASE) MCG/ACT inhaler Inhale 2 puffs into the lungs every 6 (six) hours as needed for wheezing or shortness of breath.   ALPRAZolam  (XANAX ) 0.5 MG tablet Take 0.5 mg by mouth daily as needed for anxiety.   amphetamine -dextroamphetamine  (ADDERALL) 30 MG tablet Take 1 tablet by mouth daily.   azelastine (ASTELIN) 0.1 % nasal spray Place 1 spray into both nostrils daily as needed for allergies. Use in  each nostril as directed   Dextran 70-Hypromellose, PF, (GENTEAL TEARS PF) 0.1-0.3 % SOLN Place 1 drop into both eyes at bedtime.   diphenhydrAMINE (BENADRYL) 25 MG tablet Take 25 mg by mouth every 6 (six) hours as needed for allergies or sleep.   EPINEPHrine 0.3 mg/0.3 mL IJ SOAJ injection as directed IM in the thigh Injection once; Duration: 1 days   estradiol (ESTRACE) 0.1 MG/GM vaginal cream Place 1 Applicatorful vaginally once a week.   estradiol (VIVELLE-DOT) 0.1 MG/24HR patch Place 1 patch onto the skin 2 (two) times a week.   famotidine (PEPCID) 20 MG tablet Take 20 mg by mouth daily as needed (Acid reflux).   levocetirizine (XYZAL) 5 MG tablet Take 5 mg by mouth daily as needed for allergies.   metoprolol tartrate (LOPRESSOR) 100 MG tablet Take 1 tablet (100 mg total) by mouth once. Take 90-120 minutes prior to scan. Hold for SBP less than 110.   mometasone-formoterol (DULERA) 100-5 MCG/ACT AERO Inhale 2 puffs into the lungs 2 (two) times daily.   Naphazoline-Pheniramine (OPCON-A) 0.027-0.315 % SOLN Place 1 drop into both eyes daily as needed (Allergies).   OVER THE COUNTER MEDICATION Take 1-2 capsules by mouth as needed (Congestion/Inflammation). SUPER QUERCETIN   progesterone (PROMETRIUM) 100 MG capsule Take 100 mg by mouth every other day.   "

## 2024-07-31 NOTE — Assessment & Plan Note (Addendum)
 Patient has a history of asthma, but has not had recent issues with wheezing.  Difficult to know if her symptoms are pulmonary, cardiac, or deconditioning related.  With her family history of CAD and symptoms of both chest pressure and shortness of breath, progressive in nature, further cardiac testing is clearly indicated.  I have recommended a 2D echocardiogram to assess for systolic and diastolic dysfunction as well as the presence of any valvular heart disease.  I have recommended a gated coronary CTA to assess for the presence of ischemic heart disease as a potential etiology of her worsening functional limitation.

## 2024-08-01 ENCOUNTER — Other Ambulatory Visit: Payer: Self-pay | Admitting: Cardiovascular Disease

## 2024-08-01 DIAGNOSIS — I2089 Other forms of angina pectoris: Secondary | ICD-10-CM

## 2024-08-01 DIAGNOSIS — I10 Essential (primary) hypertension: Secondary | ICD-10-CM

## 2024-08-01 DIAGNOSIS — R0609 Other forms of dyspnea: Secondary | ICD-10-CM

## 2024-08-10 ENCOUNTER — Encounter (HOSPITAL_COMMUNITY): Payer: Self-pay

## 2024-08-11 ENCOUNTER — Ambulatory Visit (HOSPITAL_COMMUNITY)
Admission: RE | Admit: 2024-08-11 | Discharge: 2024-08-11 | Disposition: A | Source: Ambulatory Visit | Attending: Cardiovascular Disease | Admitting: Cardiovascular Disease

## 2024-08-11 DIAGNOSIS — I2089 Other forms of angina pectoris: Secondary | ICD-10-CM

## 2024-08-11 DIAGNOSIS — R0609 Other forms of dyspnea: Secondary | ICD-10-CM

## 2024-08-11 MED ORDER — NITROGLYCERIN 0.4 MG SL SUBL
0.8000 mg | SUBLINGUAL_TABLET | Freq: Once | SUBLINGUAL | Status: AC
  Administered 2024-08-11: 0.8 mg via SUBLINGUAL

## 2024-08-11 MED ORDER — IOHEXOL 350 MG/ML SOLN
100.0000 mL | Freq: Once | INTRAVENOUS | Status: AC | PRN
  Administered 2024-08-11: 100 mL via INTRAVENOUS

## 2024-09-02 ENCOUNTER — Ambulatory Visit (HOSPITAL_COMMUNITY)

## 2024-09-10 ENCOUNTER — Ambulatory Visit (HOSPITAL_BASED_OUTPATIENT_CLINIC_OR_DEPARTMENT_OTHER): Admitting: Pulmonary Disease
